# Patient Record
Sex: Female | Born: 1935 | Race: White | Hispanic: No | Smoking: Never smoker
Health system: Southern US, Community
[De-identification: ages and names within clinical notes are randomized; demographics above are authoritative.]

## PROBLEM LIST (undated history)

## (undated) DIAGNOSIS — Z9049 Acquired absence of other specified parts of digestive tract: Secondary | ICD-10-CM

## (undated) DIAGNOSIS — M5090 Cervical disc disorder, unspecified, unspecified cervical region: Secondary | ICD-10-CM

## (undated) DIAGNOSIS — J8410 Pulmonary fibrosis, unspecified: Secondary | ICD-10-CM

## (undated) DIAGNOSIS — L409 Psoriasis, unspecified: Secondary | ICD-10-CM

## (undated) DIAGNOSIS — I10 Essential (primary) hypertension: Secondary | ICD-10-CM

## (undated) DIAGNOSIS — I482 Chronic atrial fibrillation, unspecified: Secondary | ICD-10-CM

## (undated) DIAGNOSIS — M1990 Unspecified osteoarthritis, unspecified site: Secondary | ICD-10-CM

## (undated) DIAGNOSIS — E785 Hyperlipidemia, unspecified: Secondary | ICD-10-CM

## (undated) DIAGNOSIS — Z87898 Personal history of other specified conditions: Secondary | ICD-10-CM

## (undated) DIAGNOSIS — C801 Malignant (primary) neoplasm, unspecified: Secondary | ICD-10-CM

## (undated) DIAGNOSIS — R05 Cough: Secondary | ICD-10-CM

## (undated) DIAGNOSIS — I38 Endocarditis, valve unspecified: Secondary | ICD-10-CM

## (undated) DIAGNOSIS — S329XXA Fracture of unspecified parts of lumbosacral spine and pelvis, initial encounter for closed fracture: Secondary | ICD-10-CM

## (undated) HISTORY — DX: Cough: R05

## (undated) HISTORY — DX: Psoriasis, unspecified: L40.9

## (undated) HISTORY — DX: Fracture of unspecified parts of lumbosacral spine and pelvis, initial encounter for closed fracture: S32.9XXA

## (undated) HISTORY — DX: Endocarditis, valve unspecified: I38

## (undated) HISTORY — DX: Personal history of other specified conditions: Z87.898

## (undated) HISTORY — PX: OTHER SURGICAL HISTORY: SHX169

## (undated) HISTORY — DX: Malignant (primary) neoplasm, unspecified: C80.1

## (undated) HISTORY — DX: Acquired absence of other specified parts of digestive tract: Z90.49

## (undated) HISTORY — DX: Hyperlipidemia, unspecified: E78.5

## (undated) HISTORY — DX: Essential (primary) hypertension: I10

## (undated) HISTORY — DX: Unspecified osteoarthritis, unspecified site: M19.90

## (undated) HISTORY — PX: SKIN CANCER EXCISION: SHX779

## (undated) HISTORY — PX: TOTAL ABDOMINAL HYSTERECTOMY W/ BILATERAL SALPINGOOPHORECTOMY: SHX83

## (undated) HISTORY — DX: Cervical disc disorder, unspecified, unspecified cervical region: M50.90

---

## 2004-07-10 HISTORY — PX: CARDIAC CATHETERIZATION: SHX172

## 2005-07-10 HISTORY — PX: BACK SURGERY: SHX140

## 2009-12-24 ENCOUNTER — Emergency Department: Payer: Self-pay | Admitting: Emergency Medicine

## 2010-07-10 DIAGNOSIS — S329XXA Fracture of unspecified parts of lumbosacral spine and pelvis, initial encounter for closed fracture: Secondary | ICD-10-CM

## 2010-07-10 HISTORY — DX: Fracture of unspecified parts of lumbosacral spine and pelvis, initial encounter for closed fracture: S32.9XXA

## 2010-12-07 ENCOUNTER — Emergency Department: Payer: Self-pay | Admitting: Unknown Physician Specialty

## 2011-02-08 DIAGNOSIS — R053 Chronic cough: Secondary | ICD-10-CM

## 2011-02-08 HISTORY — DX: Chronic cough: R05.3

## 2011-04-03 ENCOUNTER — Ambulatory Visit: Payer: Self-pay | Admitting: Internal Medicine

## 2011-05-17 ENCOUNTER — Ambulatory Visit: Payer: Self-pay | Admitting: Internal Medicine

## 2011-11-16 ENCOUNTER — Inpatient Hospital Stay: Payer: Self-pay | Admitting: Internal Medicine

## 2011-11-16 LAB — CBC WITH DIFFERENTIAL/PLATELET
Basophil #: 0 10*3/uL (ref 0.0–0.1)
Basophil %: 0.7 %
Eosinophil %: 3 %
HCT: 41 % (ref 35.0–47.0)
Lymphocyte %: 28.3 %
Monocyte #: 1.1 x10 3/mm — ABNORMAL HIGH (ref 0.2–0.9)
Monocyte %: 17.4 %
Neutrophil #: 3.1 10*3/uL (ref 1.4–6.5)
Neutrophil %: 50.6 %
Platelet: 138 10*3/uL — ABNORMAL LOW (ref 150–440)
RBC: 4.22 10*6/uL (ref 3.80–5.20)
RDW: 14.3 % (ref 11.5–14.5)

## 2011-11-16 LAB — COMPREHENSIVE METABOLIC PANEL
Albumin: 3.3 g/dL — ABNORMAL LOW (ref 3.4–5.0)
Alkaline Phosphatase: 89 U/L (ref 50–136)
BUN: 41 mg/dL — ABNORMAL HIGH (ref 7–18)
Bilirubin,Total: 0.2 mg/dL (ref 0.2–1.0)
Calcium, Total: 8.5 mg/dL (ref 8.5–10.1)
Chloride: 102 mmol/L (ref 98–107)
Co2: 30 mmol/L (ref 21–32)
EGFR (African American): 37 — ABNORMAL LOW
EGFR (Non-African Amer.): 32 — ABNORMAL LOW
Glucose: 126 mg/dL — ABNORMAL HIGH (ref 65–99)
Osmolality: 291 (ref 275–301)
Potassium: 3.5 mmol/L (ref 3.5–5.1)
Sodium: 140 mmol/L (ref 136–145)
Total Protein: 7 g/dL (ref 6.4–8.2)

## 2011-11-16 LAB — CK TOTAL AND CKMB (NOT AT ARMC): CK, Total: 143 U/L (ref 21–215)

## 2011-11-17 LAB — CBC WITH DIFFERENTIAL/PLATELET
Basophil #: 0 10*3/uL (ref 0.0–0.1)
Basophil %: 0.9 %
Eosinophil %: 1 %
HGB: 13.1 g/dL (ref 12.0–16.0)
Lymphocyte #: 1.2 10*3/uL (ref 1.0–3.6)
Lymphocyte %: 23.4 %
MCH: 32 pg (ref 26.0–34.0)
Monocyte #: 0.4 x10 3/mm (ref 0.2–0.9)
Neutrophil #: 3.5 10*3/uL (ref 1.4–6.5)
Neutrophil %: 67.8 %
Platelet: 136 10*3/uL — ABNORMAL LOW (ref 150–440)
WBC: 5.2 10*3/uL (ref 3.6–11.0)

## 2011-11-17 LAB — URINALYSIS, COMPLETE
Bacteria: NONE SEEN
Blood: NEGATIVE
Leukocyte Esterase: NEGATIVE
Nitrite: NEGATIVE
RBC,UR: 11 /HPF (ref 0–5)
Transitional Epi: <1
WBC UR: 1 /HPF (ref 0–5)

## 2011-11-17 LAB — BASIC METABOLIC PANEL
Anion Gap: 8 (ref 7–16)
BUN: 35 mg/dL — ABNORMAL HIGH (ref 7–18)
Calcium, Total: 8.2 mg/dL — ABNORMAL LOW (ref 8.5–10.1)
Creatinine: 1.28 mg/dL (ref 0.60–1.30)
EGFR (Non-African Amer.): 38 — ABNORMAL LOW
Glucose: 202 mg/dL — ABNORMAL HIGH (ref 65–99)
Potassium: 3.9 mmol/L (ref 3.5–5.1)

## 2011-11-17 LAB — CK TOTAL AND CKMB (NOT AT ARMC)
CK, Total: 132 U/L (ref 21–215)
CK-MB: 2.8 ng/mL (ref 0.5–3.6)

## 2011-11-17 LAB — SEDIMENTATION RATE: Erythrocyte Sed Rate: 43 mm/hr — ABNORMAL HIGH (ref 0–30)

## 2011-11-17 LAB — TROPONIN I: Troponin-I: <0.02 ng/mL

## 2011-11-18 LAB — CBC WITH DIFFERENTIAL/PLATELET
Basophil #: 0 10*3/uL (ref 0.0–0.1)
Basophil %: 0.1 %
Eosinophil %: 0 %
HCT: 39.4 % (ref 35.0–47.0)
Lymphocyte %: 16.7 %
MCH: 32.2 pg (ref 26.0–34.0)
MCHC: 33.3 g/dL (ref 32.0–36.0)
Monocyte #: 0.4 x10 3/mm (ref 0.2–0.9)
Platelet: 170 10*3/uL (ref 150–440)
RDW: 14.3 % (ref 11.5–14.5)
WBC: 7.2 10*3/uL (ref 3.6–11.0)

## 2011-11-18 LAB — BASIC METABOLIC PANEL
BUN: 29 mg/dL — ABNORMAL HIGH (ref 7–18)
Calcium, Total: 8.8 mg/dL (ref 8.5–10.1)
Chloride: 105 mmol/L (ref 98–107)
Creatinine: 1.15 mg/dL (ref 0.60–1.30)
EGFR (African American): 50 — ABNORMAL LOW
Glucose: 137 mg/dL — ABNORMAL HIGH (ref 65–99)
Osmolality: 289 (ref 275–301)
Potassium: 4.3 mmol/L (ref 3.5–5.1)

## 2011-11-22 LAB — CULTURE, BLOOD (SINGLE)

## 2012-02-13 ENCOUNTER — Encounter: Payer: Self-pay | Admitting: Internal Medicine

## 2012-03-10 ENCOUNTER — Encounter: Payer: Self-pay | Admitting: Internal Medicine

## 2012-03-22 ENCOUNTER — Ambulatory Visit: Payer: Self-pay | Admitting: Cardiovascular Disease

## 2012-03-28 ENCOUNTER — Encounter: Payer: Self-pay | Admitting: *Deleted

## 2012-04-04 ENCOUNTER — Ambulatory Visit (INDEPENDENT_AMBULATORY_CARE_PROVIDER_SITE_OTHER): Payer: Medicare HMO | Admitting: Cardiovascular Disease

## 2012-04-04 ENCOUNTER — Encounter: Payer: Self-pay | Admitting: Cardiovascular Disease

## 2012-04-04 VITALS — BP 132/78 | HR 79 | Ht 60.0 in | Wt 151.2 lb

## 2012-04-04 DIAGNOSIS — R0602 Shortness of breath: Secondary | ICD-10-CM

## 2012-04-04 DIAGNOSIS — I38 Endocarditis, valve unspecified: Secondary | ICD-10-CM

## 2012-04-04 NOTE — Patient Instructions (Addendum)
Take Furosemide 20 mg daily as needed for leg swelling or weight gain of >2 lbs in 48 hours Follow up in 6 months

## 2012-04-05 DIAGNOSIS — I38 Endocarditis, valve unspecified: Secondary | ICD-10-CM | POA: Insufficient documentation

## 2012-04-05 DIAGNOSIS — R0602 Shortness of breath: Secondary | ICD-10-CM | POA: Insufficient documentation

## 2012-04-05 NOTE — Assessment & Plan Note (Signed)
This is likely multifactorial due to pulmonary hypertension, diastolic heart failure and age related physical deconditioning. She does not appear to be significantly fluid overloaded. She uses Lasix only as needed. No symptoms suggestive of angina.

## 2012-04-05 NOTE — Progress Notes (Signed)
HPI  This is an 78 year old female who was referred by Dr. Daniel Nones to establish cardiovascular care. The patient has history of valvular heart disease. Echocardiogram done in February of 2012 showed normal LV systolic function, moderate left ventricular hypertrophy, aortic sclerosis with moderate to severe aortic insufficiency, mild mitral regurgitation with mild pulmonary hypertension. The patient was being followed by Dr. Darrold Junker but requested transfer. She also has possible interstitial lung disease with restrictive pulmonary function testing. She was recently evaluated by Dr. Ala Dach at the pulmonary hypertension clinic at [facility]. It appears that she had a repeat echocardiogram. Which showed mild to moderate pulmonary hypertension with a TR jet velocity of 2.7 m/s. It was thought that the etiology was diastolic heart failure possible lung disease. The severity did not worsen further treatment. The patient overall is doing reasonably well. She has mild exertional dyspnea which has not worsened. She denies any recent lower extremity edema. She did have problem in the past but this does not seem to be an issue at this time. She denies any chest pain, dizziness or syncope.  Allergies  Allergen Reactions  . Lipitor (Atorvastatin)   . Simvastatin      Current Outpatient Prescriptions on File Prior to Visit  Medication Sig Dispense Refill  . diltiazem (DILACOR XR) 180 MG 24 hr capsule Take 180 mg by mouth daily.      Marland Kitchen olmesartan (BENICAR) 40 MG tablet Take 40 mg by mouth daily.         Past Medical History  Diagnosis Date  . Chronic cough 02/2011    CXR with increased interstitial markings [facility] pulmonology  . Psoriasis     elbows  . Hypertension   . Arthritis   . Pelvis fracture 2012  . History of dizziness   . Valvular heart disease   . Cervical disc disease   . Hx of appendectomy   . Hyperlipidemia   . Cancer     skin     Past Surgical History  Procedure Date  . Back surgery  2007  . Total abdominal hysterectomy w/ bilateral salpingoophorectomy   . Skin cancer excision   . Cardiac catheterization 2006     History reviewed. No pertinent family history.   History   Social History  . Marital Status: Widowed    Spouse Name: N/A    Number of Children: N/A  . Years of Education: N/A   Occupational History  . Not on file.   Social History Main Topics  . Smoking status: Never Smoker   . Smokeless tobacco: Not on file  . Alcohol Use: Yes     occasional  . Drug Use: No  . Sexually Active:    Other Topics Concern  . Not on file   Social History Narrative  . No narrative on file     ROS Constitutional: Negative for fever, chills, diaphoresis, activity change, appetite change and fatigue.  HENT: Negative for hearing loss, nosebleeds, congestion, sore throat, facial swelling, drooling, trouble swallowing, neck pain, voice change, sinus pressure and tinnitus.  Eyes: Negative for photophobia, pain, discharge and visual disturbance.  Respiratory: Negative for apnea, cough, chest tightness  and wheezing.  Cardiovascular: Negative for chest pain, palpitations and leg swelling.  Gastrointestinal: Negative for nausea, vomiting, abdominal pain, diarrhea, constipation, blood in stool and abdominal distention.  Genitourinary: Negative for dysuria, urgency, frequency, hematuria and decreased urine volume.  Musculoskeletal: Negative for myalgias, back pain, joint swelling, arthralgias and gait problem.  Skin: Negative  for color change, pallor, rash and wound.  Neurological: Negative for dizziness, tremors, seizures, syncope, speech difficulty, weakness, light-headedness, numbness and headaches.  Psychiatric/Behavioral: Negative for suicidal ideas, hallucinations, behavioral problems and agitation. The patient is not nervous/anxious.     PHYSICAL EXAM   BP 132/78  Pulse 79  Ht 5' (1.524 m)  Wt 151 lb 4 oz (68.607 kg)  BMI 29.54 kg/m2 Constitutional: She  is oriented to person, place, and time. She appears well-developed and well-nourished. No distress.  HENT: No nasal discharge.  Head: Normocephalic and atraumatic.  Eyes: Pupils are equal and round. Right eye exhibits no discharge. Left eye exhibits no discharge.  Neck: Normal range of motion. Neck supple. No JVD present. No thyromegaly present.  Cardiovascular: Normal rate, regular rhythm, normal heart sounds. Exam reveals no gallop and no friction rub. There is a 2/6 systolic ejection murmur at the aortic area without any diastolic murmur.  Pulmonary/Chest: Effort normal and breath sounds normal. No stridor. No respiratory distress. She has no wheezes. She has no rales. She exhibits no tenderness.  Abdominal: Soft. Bowel sounds are normal. She exhibits no distension. There is no tenderness. There is no rebound and no guarding.  Musculoskeletal: Normal range of motion. She exhibits no edema and no tenderness.  Neurological: She is alert and oriented to person, place, and time. Coordination normal.  Skin: Skin is warm and dry. No rash noted. She is not diaphoretic. No erythema. No pallor.  Psychiatric: She has a normal mood and affect. Her behavior is normal. Judgment and thought content normal.     EKG: Sinus  Rhythm  -Left axis -anterior fascicular block.   ABNORMAL    ASSESSMENT AND PLAN

## 2012-04-05 NOTE — Assessment & Plan Note (Signed)
She has a heart murmur suggestive of aortic sclerosis. There was a previous mention of moderate to severe aortic insufficiency. She had a recent echocardiogram at [facility]. I will request a copy.

## 2012-04-09 ENCOUNTER — Encounter: Payer: Self-pay | Admitting: Internal Medicine

## 2012-05-10 ENCOUNTER — Encounter: Payer: Self-pay | Admitting: Internal Medicine

## 2012-05-29 ENCOUNTER — Ambulatory Visit: Payer: Self-pay | Admitting: Physical Medicine and Rehabilitation

## 2012-06-09 ENCOUNTER — Encounter: Payer: Self-pay | Admitting: Internal Medicine

## 2012-06-16 ENCOUNTER — Emergency Department: Payer: Self-pay | Admitting: Emergency Medicine

## 2012-07-10 ENCOUNTER — Encounter: Payer: Self-pay | Admitting: Internal Medicine

## 2012-08-10 ENCOUNTER — Encounter: Payer: Self-pay | Admitting: Internal Medicine

## 2012-08-26 ENCOUNTER — Encounter: Payer: Self-pay | Admitting: Pulmonary Disease

## 2012-08-26 ENCOUNTER — Ambulatory Visit (INDEPENDENT_AMBULATORY_CARE_PROVIDER_SITE_OTHER): Payer: Medicare HMO | Admitting: Pulmonary Disease

## 2012-08-26 VITALS — BP 120/70 | HR 74 | Temp 98.2°F | Ht 60.0 in | Wt 155.0 lb

## 2012-08-26 DIAGNOSIS — R059 Cough, unspecified: Secondary | ICD-10-CM

## 2012-08-26 DIAGNOSIS — J8410 Pulmonary fibrosis, unspecified: Secondary | ICD-10-CM | POA: Insufficient documentation

## 2012-08-26 DIAGNOSIS — I499 Cardiac arrhythmia, unspecified: Secondary | ICD-10-CM | POA: Insufficient documentation

## 2012-08-26 DIAGNOSIS — R05 Cough: Secondary | ICD-10-CM

## 2012-08-26 NOTE — Assessment & Plan Note (Signed)
Likely due to pulmonary fibrosis and exacerbated by post nasal drip.  She states that it doesn't bother her too much.  Plan -advised to start saline rinses for sinus congestion if the cough starts to bother her

## 2012-08-26 NOTE — Progress Notes (Signed)
Subjective:    Patient ID: [name], female    DOB: [birth year], 79 y.o.   MRN: [id number]  HPI  [name] is an 79 year old female with pulmonary fibrosis who comes to our clinic for evaluation and management of the same. She had no childhood respiratory illnesses and experienced no significant respiratory symptoms as an adult until about 2007. She never smoked cigarettes. 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 She does not recall the details of the workup of her illness but she states that she was eventually diagnosed with pulmonary fibrosis. She is unaware of what type of fibrosis she has. She has never had a biopsy. She has been followed by a physician at [facility] for this for the last several years and apparently this physician is changing roles and so she is seeking a new pulmonologist. She tells me that she experiences a cough in the mornings productive of clear to yellow sputum. She refused oxygen for many years but apparently agreed to start using it in the last year. She denies any shortness of breath but she states that she "gives out easy". She just completed pulmonary rehabilitation at [facility] last week. Her primary care physician is Dr. Worthy Keeler. She denies acid reflux or sinus symptoms.   Past Medical History  Diagnosis Date  . Chronic cough 02/2011    CXR with increased interstitial markings [facility] pulmonology  . Psoriasis     elbows  . Hypertension   . Arthritis   . Pelvis fracture 2012  . History of dizziness   . Valvular heart disease   . Cervical disc disease   . Hx of appendectomy   . Hyperlipidemia   . Cancer     skin     No family history on file.   History   Social History  . Marital Status: Widowed    Spouse Name: N/A    Number of Children: N/A  . Years of Education: N/A   Occupational History  . Not on file.   Social History Main Topics  . Smoking status: Never Smoker    . Smokeless tobacco: Not on file  . Alcohol Use: Yes     Comment: occasional  . Drug Use: No  . Sexually Active:    Other Topics Concern  . Not on file   Social History Narrative  . No narrative on file     Allergies  Allergen Reactions  . Lipitor (Atorvastatin)   . Simvastatin      Outpatient Prescriptions Prior to Visit  Medication Sig Dispense Refill  . diltiazem (DILACOR XR) 180 MG 24 hr capsule Take 180 mg by mouth daily.      . NON FORMULARY Oxygen 2-3 liter as needed.      Marland Kitchen olmesartan (BENICAR) 40 MG tablet Take 40 mg by mouth daily.      . Biotin 5000 MCG CAPS Take by mouth daily.      . Coenzyme Q10 (CO Q 10 PO) Take 50 mg by mouth.       No facility-administered medications prior to visit.      Review of Systems  Constitutional: Negative for fever and unexpected weight change.  HENT: Negative for ear pain, nosebleeds, congestion, sore throat, rhinorrhea, sneezing, trouble swallowing, dental problem, postnasal drip and sinus pressure.   Eyes: Negative for redness and itching.  Respiratory: Positive for cough and shortness of breath. Negative  for chest tightness and wheezing.   Cardiovascular: Negative for palpitations and leg swelling.  Gastrointestinal: Negative for nausea and vomiting.  Genitourinary: Negative for dysuria.  Musculoskeletal: Negative for joint swelling.  Skin: Negative for rash.  Neurological: Negative for headaches.  Hematological: Does not bruise/bleed easily.  Psychiatric/Behavioral: Negative for dysphoric mood. The patient is not nervous/anxious.        Objective:   Physical Exam  Filed Vitals:   08/26/12 1504  BP: 120/70  Pulse: 74  Temp: 98.2 F (36.8 C)  TempSrc: Oral  Height: 5' (1.524 m)  Weight: 155 lb (70.308 kg)  SpO2: 93%   2 L nasal cannula  Gen: well appearing, no acute distress HEENT: NCAT, PERRL, EOMi, OP clear, neck supple without masses PULM: Significant wheezing and rhonchi bilaterally with inspiratory  crackles in the bases. CV: RRR, systolic murmur noted, no JVD AB: BS+, soft, nontender, no hsm Ext: warm, trace pretibial edema, no clubbing, no cyanosis Derm: no rash or skin breakdown Neuro: A&Ox4, CN II-XII intact, strength 5/5 in all 4 extremities       Assessment & Plan:   Postinflammatory pulmonary fibrosis It is unclear to me what type of pulmonary fibrosis she has. We need to get records from [facility].  She seems to have very little symptoms other than cough which really doesn't bother her that much.  I think that she minimizes her symptoms.    Plan: -obtain records from [facility] -continue oxygen as written -obtain records from [facility] for data -4-6 month follow up  Cough Likely due to pulmonary fibrosis and exacerbated by post nasal drip.  She states that it doesn't bother her too much.  Plan -advised to start saline rinses for sinus congestion if the cough starts to bother her  Irregular heart beat Today's EKG showed frequent PAC's but no pathologic rhythm.  She does not have symptoms related to this so we are both reassured by the EKG.   Updated Medication List Outpatient Encounter Prescriptions as of 08/26/2012  Medication Sig Dispense Refill  . diltiazem (DILACOR XR) 180 MG 24 hr capsule Take 180 mg by mouth daily.      . NON FORMULARY Oxygen 2-3 liter as needed.      Marland Kitchen olmesartan (BENICAR) 40 MG tablet Take 40 mg by mouth daily.      . [DISCONTINUED] Biotin 5000 MCG CAPS Take by mouth daily.      . [DISCONTINUED] Coenzyme Q10 (CO Q 10 PO) Take 50 mg by mouth.       No facility-administered encounter medications on file as of 08/26/2012.

## 2012-08-26 NOTE — Patient Instructions (Addendum)
Keep exercising at [employer] as you are doing We will request records from [facility] and [employer] [facility] using your oxygen as you are doing 2L/min at baseline and 4L/min while exerting yourself  We will see you back in 6 months or sooner if needed

## 2012-08-26 NOTE — Assessment & Plan Note (Signed)
Today's EKG showed frequent PAC's but no pathologic rhythm.  She does not have symptoms related to this so we are both reassured by the EKG.

## 2012-08-26 NOTE — Assessment & Plan Note (Signed)
It is unclear to me what type of pulmonary fibrosis she has. We need to get records from [facility].  She seems to have very little symptoms other than cough which really doesn't bother her that much.  I think that she minimizes her symptoms.    Plan: -obtain records from [facility] -continue oxygen as written -obtain records from [facility] for data -4-6 month follow up

## 2012-11-18 ENCOUNTER — Ambulatory Visit: Payer: Medicare HMO | Admitting: Cardiovascular Disease

## 2012-12-06 ENCOUNTER — Ambulatory Visit (INDEPENDENT_AMBULATORY_CARE_PROVIDER_SITE_OTHER): Payer: Medicare HMO | Admitting: Cardiovascular Disease

## 2012-12-06 ENCOUNTER — Encounter: Payer: Self-pay | Admitting: Cardiovascular Disease

## 2012-12-06 VITALS — BP 143/86 | HR 73 | Ht 60.0 in | Wt 154.8 lb

## 2012-12-06 DIAGNOSIS — I38 Endocarditis, valve unspecified: Secondary | ICD-10-CM

## 2012-12-06 DIAGNOSIS — I499 Cardiac arrhythmia, unspecified: Secondary | ICD-10-CM

## 2012-12-06 NOTE — Patient Instructions (Addendum)
Follow up as needed

## 2012-12-06 NOTE — Progress Notes (Signed)
HPI  This is an 79 year old female who is here today for a followup visit.  She was seen last year for dyspnea and reported aortic insufficiency. Echocardiogram done in February of 2012 showed normal LV systolic function, moderate left ventricular hypertrophy, aortic sclerosis with moderate to severe aortic insufficiency, mild mitral regurgitation with mild pulmonary hypertension. She also has interstitial lung disease with restrictive pulmonary function testing. She was evaluated last year by Dr. Ala Dach at the pulmonary hypertension clinic at [facility]. She had an echocardiogram repeated there which showed normal LV systolic function, diastolic dysfunction, trivial mitral regurgitation, aortic sclerosis with only mild regurgitation and no significant pulmonary hypertension.  She has been attending pulmonary rehabilitation. She had hypoxia with exercise. She responded very well to supplemental oxygen. She denies any chest pain at this point.  Allergies  Allergen Reactions  . Lipitor (Atorvastatin)   . Simvastatin      Current Outpatient Prescriptions on File Prior to Visit  Medication Sig Dispense Refill  . diltiazem (DILACOR XR) 180 MG 24 hr capsule Take 180 mg by mouth daily.      . NON FORMULARY Oxygen 2-4 liter as needed.      Marland Kitchen olmesartan (BENICAR) 40 MG tablet Take 40 mg by mouth daily.       No current facility-administered medications on file prior to visit.     Past Medical History  Diagnosis Date  . Chronic cough 02/2011    CXR with increased interstitial markings [facility] pulmonology  . Psoriasis     elbows  . Hypertension   . Arthritis   . Pelvis fracture 2012  . History of dizziness   . Valvular heart disease   . Cervical disc disease   . Hx of appendectomy   . Hyperlipidemia   . Cancer     skin     Past Surgical History  Procedure Laterality Date  . Back surgery  2007  . Total abdominal hysterectomy w/ bilateral salpingoophorectomy    . Skin cancer excision    .  Cardiac catheterization  2006     History reviewed. No pertinent family history.   History   Social History  . Marital Status: Widowed    Spouse Name: N/A    Number of Children: N/A  . Years of Education: N/A   Occupational History  . Not on file.   Social History Main Topics  . Smoking status: Never Smoker   . Smokeless tobacco: Not on file  . Alcohol Use: Yes     Comment: occasional  . Drug Use: No  . Sexually Active:    Other Topics Concern  . Not on file   Social History Narrative  . No narrative on file     ROS Constitutional: Negative for fever, chills, diaphoresis, activity change, appetite change and fatigue.  HENT: Negative for hearing loss, nosebleeds, congestion, sore throat, facial swelling, drooling, trouble swallowing, neck pain, voice change, sinus pressure and tinnitus.  Eyes: Negative for photophobia, pain, discharge and visual disturbance.  Respiratory: Negative for apnea, cough, chest tightness  and wheezing.  Cardiovascular: Negative for chest pain, palpitations and leg swelling.  Gastrointestinal: Negative for nausea, vomiting, abdominal pain, diarrhea, constipation, blood in stool and abdominal distention.  Genitourinary: Negative for dysuria, urgency, frequency, hematuria and decreased urine volume.  Musculoskeletal: Negative for myalgias, back pain, joint swelling, arthralgias and gait problem.  Skin: Negative for color change, pallor, rash and wound.  Neurological: Negative for dizziness, tremors, seizures, syncope, speech difficulty, weakness,  light-headedness, numbness and headaches.  Psychiatric/Behavioral: Negative for suicidal ideas, hallucinations, behavioral problems and agitation. The patient is not nervous/anxious.     PHYSICAL EXAM   BP 143/86  Pulse 73  Ht 5' (1.524 m)  Wt 154 lb 12 oz (70.194 kg)  BMI 30.22 kg/m2 Constitutional: She is oriented to person, place, and time. She appears well-developed and well-nourished. No  distress.  HENT: No nasal discharge.  Head: Normocephalic and atraumatic.  Eyes: Pupils are equal and round. Right eye exhibits no discharge. Left eye exhibits no discharge.  Neck: Normal range of motion. Neck supple. No JVD present. No thyromegaly present.  Cardiovascular: Normal rate, regular rhythm, normal heart sounds. Exam reveals no gallop and no friction rub. There is a 2/6 systolic ejection murmur at the aortic area without any diastolic murmur.  Pulmonary/Chest: Effort normal and breath sounds normal. No stridor. No respiratory distress. She has no wheezes. She has dry crackles. She exhibits no tenderness.  Abdominal: Soft. Bowel sounds are normal. She exhibits no distension. There is no tenderness. There is no rebound and no guarding.  Musculoskeletal: Normal range of motion. She exhibits no edema and no tenderness.  Neurological: She is alert and oriented to person, place, and time. Coordination normal.  Skin: Skin is warm and dry. No rash noted. She is not diaphoretic. No erythema. No pallor.  Psychiatric: She has a normal mood and affect. Her behavior is normal. Judgment and thought content normal.      ASSESSMENT AND PLAN

## 2012-12-06 NOTE — Assessment & Plan Note (Signed)
Most recent echocardiogram in September of last year showed only mild aortic insufficiency and no other significant abnormalities. Thus, she can followup with [place] as needed. Most of her symptoms seems to be related to pulmonary fibrosis.

## 2012-12-06 NOTE — Assessment & Plan Note (Signed)
She was noted to have frequent PACs and recent EKG. However, she is asymptomatic. LV systolic function was normal last year. No further treatment is warranted.

## 2012-12-24 ENCOUNTER — Telehealth: Payer: Self-pay | Admitting: Pulmonary Disease

## 2012-12-24 ENCOUNTER — Encounter: Payer: Self-pay | Admitting: Internal Medicine

## 2012-12-24 NOTE — Telephone Encounter (Signed)
We did an EKG but I do not see where we have ever done a spirometry or PFT. BQ did not order rehab, pt was already enrolled.  [id number]. Carron Curie, CMA

## 2012-12-25 NOTE — Telephone Encounter (Signed)
I spoke with [name] and made her aware. She stated she would like the EKG faxed over to her. This has been done

## 2012-12-25 NOTE — Telephone Encounter (Signed)
Laureen from [employer] returning call can be reached at [phone].[name]

## 2013-01-07 ENCOUNTER — Encounter: Payer: Self-pay | Admitting: Internal Medicine

## 2013-02-07 ENCOUNTER — Encounter: Payer: Self-pay | Admitting: Internal Medicine

## 2013-03-10 ENCOUNTER — Encounter: Payer: Self-pay | Admitting: Internal Medicine

## 2013-04-09 ENCOUNTER — Encounter: Payer: Self-pay | Admitting: Internal Medicine

## 2013-05-09 ENCOUNTER — Encounter: Payer: Self-pay | Admitting: Pulmonary Disease

## 2013-05-10 ENCOUNTER — Encounter: Payer: Self-pay | Admitting: Internal Medicine

## 2013-06-09 ENCOUNTER — Encounter: Payer: Self-pay | Admitting: Internal Medicine

## 2013-10-28 ENCOUNTER — Emergency Department: Payer: Self-pay | Admitting: Emergency Medicine

## 2013-11-03 ENCOUNTER — Ambulatory Visit: Payer: Self-pay | Admitting: Internal Medicine

## 2013-11-11 ENCOUNTER — Encounter: Payer: Self-pay | Admitting: Pulmonary Disease

## 2013-11-11 ENCOUNTER — Ambulatory Visit (INDEPENDENT_AMBULATORY_CARE_PROVIDER_SITE_OTHER): Payer: Medicare HMO | Admitting: Pulmonary Disease

## 2013-11-11 VITALS — BP 122/64 | HR 74 | Ht 60.0 in | Wt 149.0 lb

## 2013-11-11 DIAGNOSIS — J8410 Pulmonary fibrosis, unspecified: Secondary | ICD-10-CM

## 2013-11-11 DIAGNOSIS — J849 Interstitial pulmonary disease, unspecified: Secondary | ICD-10-CM

## 2013-11-11 NOTE — Progress Notes (Signed)
Subjective:    Patient ID: [name], female    DOB: [birth year], 80 y.o.   MRN: [id number]  Synopsis: First seen by [facility] in 08/1192 for ILD of uncertain etiology previously followed by [facility].    HPI  11/11/2013 ROV >> [name] hasn't been seen in about 18 months or so by [place].  She hasn't been feeling short of breath since the last visit.  She continues to use oxygen.  She hasn't been seen by [facility] in a few years.  Overall she doesn't really feel like her lungs bother her much.  Her oxygen level has not changed in the last few years. She has been doing a [employer] exercise program in her basement.  She has difficulty walking due to thigh weakness and back pain, not dyspnea.  Minimal cough.  Past Medical History  Diagnosis Date  . Chronic cough 02/2011    CXR with increased interstitial markings [facility] pulmonology  . Psoriasis     elbows  . Hypertension   . Arthritis   . Pelvis fracture 2012  . History of dizziness   . Valvular heart disease   . Cervical disc disease   . Hx of appendectomy   . Hyperlipidemia   . Cancer     skin     Review of Systems     Objective:   Physical Exam Filed Vitals:   11/11/13 1059  BP: 122/64  Pulse: 74  Height: 5' (1.524 m)  Weight: 149 lb (67.586 kg)  SpO2: 95%   3Lpm  Gen: well appearing, no acute distress HEENT: NCAT, PERRL, EOMi, OP clear, neck supple without masses PULM:Crackles in bases bilaterally, no wheezing CV: RRR, systolic murmur noted, no JVD AB: BS+, soft, nontender, no hsm Ext: warm, no edema, no clubbing, no cyanosis Derm: no rash or skin breakdown Neuro: A&Ox4, CN II-XII intact, strength 5/5 in all 4 extremities  May 2013 CT chest reviewed> no pulmonary embolism, there is significant groundglass interstitial changes with some bronchiectasis in the bases. There is no clear honeycombing. The groundglass and interstitial thickening does not seem to be localized to the periphery. The interstitial changes are  more significant in the bases of the lungs than in the upper lobes.     Assessment & Plan:   Postinflammatory pulmonary fibrosis She appears to be stable and that her oxygen requirements have not changed in the last several years. Further, she is not expressing shortness of breath of any kind despite the market changes seen on her CT chest.  As for the etiology of her interstitial lung disease is feeling not clear to me. At this point it doesn't appear to be progressive.  I will again request records from [facility] to try to figure out what sort of a workup has been done in the past.  Plan: -Obtain pulmonary function tests that I can compare to what was done prior [place] old records from [facility] as written -At this point no indication for treatment -If I am unable to get any feedback from [facility] then I will consider my on workup, however considering the relative stability of her disease and her age it's not clear to me that a workup will change her management.    Updated Medication List Outpatient Encounter Prescriptions as of 11/11/2013  Medication Sig  . diltiazem (DILACOR XR) 180 MG 24 hr capsule Take 180 mg by mouth daily.  Marland Kitchen levothyroxine (SYNTHROID, LEVOTHROID) 50 MCG tablet  Take 50 mcg by mouth daily before breakfast.  . NON FORMULARY Oxygen 2-4 liter as needed.  Marland Kitchen olmesartan (BENICAR) 40 MG tablet Take 40 mg by mouth daily.

## 2013-11-11 NOTE — Patient Instructions (Signed)
We will request records again from [facility] We will order pulmonary function testing at [facility] using your oxygen as you are doing. We will see you back in 6 months or sooner if needed

## 2013-11-11 NOTE — Assessment & Plan Note (Addendum)
She appears to be stable and that her oxygen requirements have not changed in the last several years. Further, she is not expressing shortness of breath of any kind despite the market changes seen on her CT chest.  I do not know what sort of interstitial lung disease she has.  Her CT from May 2014 did not look to be consistent with UIP but this would be in the differential.  Chronic fibrosing and NSIP could also be considered. At this point it doesn't appear to be progressive.  I will again request records from [facility] to try to figure out what sort of a workup has been done in the past.  Plan: -Obtain pulmonary function tests that I can compare to what was done prior [place] old records from [place] [facility] as written -At this point no indication for treatment -If I am unable to get any feedback from [facility] then I will consider my on workup, however considering the relative stability of her disease and her age it's not clear to me that a workup will change her management.

## 2013-11-17 ENCOUNTER — Ambulatory Visit: Payer: Self-pay | Admitting: Pulmonary Disease

## 2013-11-17 LAB — PULMONARY FUNCTION TEST

## 2013-12-09 ENCOUNTER — Encounter: Payer: Self-pay | Admitting: Pulmonary Disease

## 2013-12-10 ENCOUNTER — Telehealth: Payer: Self-pay

## 2013-12-10 NOTE — Telephone Encounter (Signed)
lmtcb X1 

## 2013-12-10 NOTE — Telephone Encounter (Signed)
Message copied by Len Blalock on Wed Dec 10, 2013  9:51 AM ------      Message from: Juanito Doom      Created: Tue Dec 09, 2013  9:49 PM       A,      Please let her know that I have seen her recent PFTs and they fit with pulmonary fibrosis, but I haven't seen the studies from [facility] yet to know if her disease has progressed.  Can we send a request to [facility] to ask for pulmonary clinic notes and PFTs?            Thanks      B ------

## 2013-12-18 NOTE — Telephone Encounter (Signed)
lmtcb x2 

## 2013-12-30 NOTE — Telephone Encounter (Signed)
lmtcb X3 

## 2014-01-02 NOTE — Telephone Encounter (Signed)
lmtcb X4. Letter typed and sent per triage protocol.

## 2014-02-06 ENCOUNTER — Encounter: Payer: Self-pay | Admitting: Pulmonary Disease

## 2014-03-12 ENCOUNTER — Ambulatory Visit: Payer: Medicare HMO | Admitting: Pulmonary Disease

## 2014-04-13 ENCOUNTER — Ambulatory Visit: Payer: Self-pay | Admitting: Internal Medicine

## 2014-04-20 ENCOUNTER — Ambulatory Visit: Payer: Medicare HMO | Admitting: Pulmonary Disease

## 2014-05-25 ENCOUNTER — Encounter: Payer: Self-pay | Admitting: Pulmonary Disease

## 2014-05-25 ENCOUNTER — Ambulatory Visit (INDEPENDENT_AMBULATORY_CARE_PROVIDER_SITE_OTHER): Payer: Medicare HMO | Admitting: Pulmonary Disease

## 2014-05-25 VITALS — BP 128/60 | HR 72 | Ht 60.0 in | Wt 145.0 lb

## 2014-05-25 DIAGNOSIS — J8410 Pulmonary fibrosis, unspecified: Secondary | ICD-10-CM

## 2014-05-25 NOTE — Progress Notes (Signed)
Subjective:    Patient ID: [name], female    DOB: April 29, 1924, 80 y.o.   MRN: [id number]  Synopsis: First seen by [facility] in 10/3327 for ILD of uncertain etiology previously followed by [facility].   06 04 2013: 6-minute walk, 700 feet, 90% on 3 liters, 80% on 3 liters.  03/05/2012, 6-minute walk, 732 feet, 92% on 2 liters 2013 Spirogram reveals an FVC of 75% pre, 77% post, FEV1 89% pre, 91% post, 25-75 237% pre, 287% post consistent with a mild restrictive ventilatory defect and no definite improvement with bronchodilator therapy.  11/2013 PFT [facility] > Ratio 89%, FEV1 0.99 (82% pred, no change with BD), TLC 2.96L (77% pred), DLCO 5.8 (37% pred)  HPI  Chief Complaint  Patient presents with  . Follow-up    6 month rov.  Pt has no breathing complaints today.  States she is SOB with anxiety or exertion.       05/25/2014 ROV > Rachell had a hard time finding [place] today because she got off on the wrong exit, so she is upset about that.  However her breathing has been fine lately.  She has not hada significant decline in any of her symptoms and continues to exercise.  She would like to go back tp [employer].  No cough.   Past Medical History  Diagnosis Date  . Chronic cough 02/2011    CXR with increased interstitial markings [facility] pulmonology  . Psoriasis     elbows  . Hypertension   . Arthritis   . Pelvis fracture 2012  . History of dizziness   . Valvular heart disease   . Cervical disc disease   . Hx of appendectomy   . Hyperlipidemia   . Cancer     skin     Review of Systems      Objective:   Physical Exam  Filed Vitals:   05/25/14 1126  BP: 128/60  Pulse: 72  Height: 5' (1.524 m)  Weight: 145 lb (65.772 kg)  SpO2: 91%   3Lpm, ambulated 572feet on and stayed above 90%  Gen: well appearing, no acute distress HEENT: NCAT, EOMi, OP clear, neck supple without masses PULM:Crackles in bases bilaterally, no wheezing CV: RRR, systolic murmur noted, no  JVD AB: BS+, soft, nontender,  Ext: warm, no edema, no clubbing, no cyanosis Derm: no rash or skin breakdown Neuro: A&Ox4, CN II-XII intact, strength 5/5 in all 4 extremities  May 2013 CT chest reviewed> no pulmonary embolism, there is significant groundglass interstitial changes with some bronchiectasis in the bases. There is no clear honeycombing. The groundglass and interstitial thickening does not seem to be localized to the periphery. The interstitial changes are more significant in the bases of the lungs than in the upper lobes.     Assessment & Plan:   Postinflammatory pulmonary fibrosis I was able to see the results from [facility] pulmonary clinic today and there has not been a change in the last 2 years in her PFTs or oxygen needs.  Given her advance age I don't see a reason for further work up and certainly no role for empiric treatment with prednisone.  She would benefit from ongoing exercise.  Plan: -continue O2 at 3LPM -refer to pulmonary rehab     Updated Medication List Outpatient Encounter Prescriptions as of 05/25/2014  Medication Sig  . diltiazem (DILACOR XR) 180 MG 24 hr capsule Take 180 mg by mouth daily.  Marland Kitchen levothyroxine (SYNTHROID, LEVOTHROID) 50 MCG tablet  Take 50 mcg by mouth daily before breakfast.  . NON FORMULARY Oxygen 2-4 liter as needed.  Marland Kitchen olmesartan (BENICAR) 40 MG tablet Take 40 mg by mouth daily.

## 2014-05-25 NOTE — Patient Instructions (Signed)
We will refer you to the [facility] pulmonary rehab center We will see you back in 6 months or sooner if needed

## 2014-05-25 NOTE — Assessment & Plan Note (Signed)
I was able to see the results from [facility] pulmonary clinic today and there has not been a change in the last 2 years in her PFTs or oxygen needs.  Given her advance age I don't see a reason for further work up and certainly no role for empiric treatment with prednisone.  She would benefit from ongoing exercise.  Plan: -continue O2 at 3LPM -refer to pulmonary rehab

## 2014-06-25 ENCOUNTER — Inpatient Hospital Stay: Payer: Self-pay | Admitting: Internal Medicine

## 2014-06-25 LAB — COMPREHENSIVE METABOLIC PANEL
ALK PHOS: 97 U/L
ALT: 23 U/L
Albumin: 3.3 g/dL — ABNORMAL LOW (ref 3.4–5.0)
Anion Gap: 7 (ref 7–16)
BUN: 15 mg/dL (ref 7–18)
Bilirubin,Total: 0.3 mg/dL (ref 0.2–1.0)
Calcium, Total: 9 mg/dL (ref 8.5–10.1)
Chloride: 104 mmol/L (ref 98–107)
Co2: 30 mmol/L (ref 21–32)
Creatinine: 0.91 mg/dL (ref 0.60–1.30)
EGFR (African American): >60
EGFR (Non-African Amer.): >60
Glucose: 104 mg/dL — ABNORMAL HIGH (ref 65–99)
Osmolality: 282 (ref 275–301)
POTASSIUM: 4.1 mmol/L (ref 3.5–5.1)
SGOT(AST): 21 U/L (ref 15–37)
SODIUM: 141 mmol/L (ref 136–145)
TOTAL PROTEIN: 7 g/dL (ref 6.4–8.2)

## 2014-06-25 LAB — TROPONIN I
Troponin-I: 0.02 ng/mL
Troponin-I: 0.02 ng/mL

## 2014-06-25 LAB — CBC
HCT: 43 % (ref 35.0–47.0)
HGB: 13.6 g/dL (ref 12.0–16.0)
MCH: 30.5 pg (ref 26.0–34.0)
MCHC: 31.5 g/dL — AB (ref 32.0–36.0)
MCV: 97 fL (ref 80–100)
Platelet: 221 10*3/uL (ref 150–440)
RBC: 4.44 10*6/uL (ref 3.80–5.20)
RDW: 14.5 % (ref 11.5–14.5)
WBC: 8.4 10*3/uL (ref 3.6–11.0)

## 2014-06-25 LAB — PRO B NATRIURETIC PEPTIDE: B-Type Natriuretic Peptide: 2546 pg/mL — ABNORMAL HIGH (ref 0–450)

## 2014-06-25 LAB — CK TOTAL AND CKMB (NOT AT ARMC)
CK, Total: 71 U/L (ref 26–192)
CK-MB: 1.2 ng/mL (ref 0.5–3.6)

## 2014-06-25 LAB — MAGNESIUM: MAGNESIUM: 2.1 mg/dL

## 2014-06-26 LAB — BASIC METABOLIC PANEL
Anion Gap: 8 (ref 7–16)
BUN: 18 mg/dL (ref 7–18)
CO2: 32 mmol/L (ref 21–32)
CREATININE: 0.88 mg/dL (ref 0.60–1.30)
Calcium, Total: 8.6 mg/dL (ref 8.5–10.1)
Chloride: 101 mmol/L (ref 98–107)
EGFR (African American): >60
Glucose: 143 mg/dL — ABNORMAL HIGH (ref 65–99)
OSMOLALITY: 286 (ref 275–301)
POTASSIUM: 4.1 mmol/L (ref 3.5–5.1)
SODIUM: 141 mmol/L (ref 136–145)

## 2014-06-26 LAB — SEDIMENTATION RATE: Erythrocyte Sed Rate: 35 mm/hr — ABNORMAL HIGH (ref 0–30)

## 2014-06-26 LAB — MAGNESIUM: Magnesium: 2.1 mg/dL

## 2014-07-15 ENCOUNTER — Ambulatory Visit (INDEPENDENT_AMBULATORY_CARE_PROVIDER_SITE_OTHER): Payer: Medicare HMO | Admitting: Pulmonary Disease

## 2014-07-15 VITALS — BP 106/70 | HR 58

## 2014-07-15 DIAGNOSIS — R599 Enlarged lymph nodes, unspecified: Secondary | ICD-10-CM

## 2014-07-15 DIAGNOSIS — J8410 Pulmonary fibrosis, unspecified: Secondary | ICD-10-CM

## 2014-07-15 DIAGNOSIS — R590 Localized enlarged lymph nodes: Secondary | ICD-10-CM | POA: Insufficient documentation

## 2014-07-15 NOTE — Assessment & Plan Note (Signed)
This was seen on the CT scan of her chest. This was performed in the setting of a congestive heart failure exacerbation and could be the explanation, though the lymph node was quite large. The only way to know for certain what is going on here would be to obtain a biopsy but that would be quite difficult and she is not interested in that. We discussed various options and she and I decided to keep an eye on it with a repeat CT scan in 3 months and then we will discuss what to do if it has not changed significantly.

## 2014-07-15 NOTE — Assessment & Plan Note (Signed)
I have reviewed the images from the recent CT scan of her chest. Radiology feels that UIP is possible, which certainly considering the peripheral nature of the findings and her age one should consider UIP. However, her fibrosis has been present for years and her lung function testing has not progressed during that time. Her oxygenation has also remained normal during that time as well as her functional status. At this point I do not feel strongly that she has usual interstitial pneumonitis as this would have likely progressed I this point.  Plan: -Continue monitoring off of anti-fibrotic therapy with every 6 month visits  -continue oxygen as written

## 2014-07-15 NOTE — Progress Notes (Signed)
Subjective:    Patient ID: [name], female    DOB: 06-Oct-1923, 81 y.o.   MRN: [id number]  Synopsis: First seen by [facility] in 03/3266 for ILD of uncertain etiology previously followed by [facility].   She regularly works out in her basement with a [employer]. 06 04 2013: 6-minute walk, 700 feet, 90% on 3 liters, 80% on 3 liters.  03/05/2012, 6-minute walk, 732 feet, 92% on 2 liters 2013 Spirogram reveals an FVC of 75% pre, 77% post, FEV1 89% pre, 91% post, 25-75 237% pre, 287% post consistent with a mild restrictive ventilatory defect and no definite improvement with bronchodilator therapy.  11/2013 PFT [facility] > Ratio 89%, FEV1 0.99 (82% pred, no change with BD), TLC 2.96L (77% pred), DLCO 5.8 (37% pred)  HPI Chief Complaint  Patient presents with  . Follow-up    Pt symptoms unchanged. Still has sob with exhertion, cough with clear mucus. Patient denies wheezing and chest tightness.    07/15/2014 ROV > Katherin says she had a good Christmas and New Years.  She has not had much trouble lately.  She continues to use 3L O2 continuously, and a home health nurse and physical therapist has been measuring her oxygen level at home with exertion and it was OK.  She continues to workout on her [employer] gym.  She was hospitalized recently for a day for a multitude of tests and was told she had CHF.  She has been paying attention to her weight more at home lately since then.  Past Medical History  Diagnosis Date  . Chronic cough 02/2011    CXR with increased interstitial markings [facility] pulmonology  . Psoriasis     elbows  . Hypertension   . Arthritis   . Pelvis fracture 2012  . History of dizziness   . Valvular heart disease   . Cervical disc disease   . Hx of appendectomy   . Hyperlipidemia   . Cancer     skin     Review of Systems  Constitutional: Negative for fever, chills and fatigue.  HENT: Negative for postnasal drip, rhinorrhea and sinus pressure.    Respiratory: Negative for cough, shortness of breath and wheezing.   Cardiovascular: Negative for chest pain, palpitations and leg swelling.       Objective:   Physical Exam Filed Vitals:   07/15/14 1129  BP: 106/70  Pulse: 58  SpO2: 94%   3Lpm, ambulated 540feet on and stayed above 90%  Gen: well appearing, no acute distress HEENT: NCAT, EOMi, OP clear, neck supple without masses PULM:Crackles in bases bilaterally, no wheezing CV: RRR, systolic murmur noted, no JVD AB: BS+, soft, nontender,  Ext: warm, no edema, no clubbing, no cyanosis Derm: no rash or skin breakdown Neuro: A&Ox4, MAEW  May 2013 CT chest reviewed> no pulmonary embolism, there is significant groundglass interstitial changes with some bronchiectasis in the bases. There is no clear honeycombing. The groundglass and interstitial thickening does not seem to be localized to the periphery. The interstitial changes are more significant in the bases of the lungs than in the upper lobes. December 2015 CT angiogram chest [facility]> no pulmonary embolism, there is peripheral interlobular septal thickening and significant bronchiectasis, worse in the bases. There is no clear honeycombing. There is also increased mediastinal lymphadenopathy    Assessment & Plan:   Postinflammatory pulmonary fibrosis I have reviewed the images from the recent CT scan of her chest. Radiology feels that  UIP is possible, which certainly considering the peripheral nature of the findings and her age one should consider UIP. However, her fibrosis has been present for years and her lung function testing has not progressed during that time. Her oxygenation has also remained normal during that time as well as her functional status. At this point I do not feel strongly that she has usual interstitial pneumonitis as this would have likely progressed I this point.  Plan: -Continue monitoring off of anti-fibrotic therapy with every 6 month visits  -continue  oxygen as written  Mediastinal lymphadenopathy This was seen on the CT scan of her chest. This was performed in the setting of a congestive heart failure exacerbation and could be the explanation, though the lymph node was quite large. The only way to know for certain what is going on here would be to obtain a biopsy but that would be quite difficult and she is not interested in that. We discussed various options and she and I decided to keep an eye on it with a repeat CT scan in 3 months and then we will discuss what to do if it has not changed significantly.    Updated Medication List Outpatient Encounter Prescriptions as of 07/15/2014  Medication Sig  . alendronate (FOSAMAX) 70 MG tablet Take 70 mg by mouth once a week.  . bimatoprost (LUMIGAN) 0.03 % ophthalmic solution Place 1 drop into both eyes at bedtime.  . COMBIGAN 0.2-0.5 % ophthalmic solution Place 1 drop into both eyes 2 (two) times daily.  . cyclobenzaprine (FLEXERIL) 5 MG tablet Take 5 mg by mouth 3 (three) times daily as needed.  . diltiazem (DILACOR XR) 180 MG 24 hr capsule Take 180 mg by mouth daily.  . furosemide (LASIX) 40 MG tablet Take 40 mg by mouth every other day.  . ibuprofen (ADVIL,MOTRIN) 400 MG tablet Take 400 mg by mouth 2 (two) times daily as needed.  Marland Kitchen levothyroxine (SYNTHROID, LEVOTHROID) 50 MCG tablet Take 50 mcg by mouth daily before breakfast.  . NON FORMULARY Oxygen 2-4 liter as needed.  Marland Kitchen olmesartan (BENICAR) 40 MG tablet Take 40 mg by mouth daily.  . potassium chloride (MICRO-K) 10 MEQ CR capsule Take 10 mEq by mouth every other day.  . traMADol (ULTRAM) 50 MG tablet Take 50 mg by mouth 2 (two) times daily as needed.

## 2014-07-15 NOTE — Patient Instructions (Signed)
We will arrange a CT  hest in 3 months to evaluate the lymph nodes in your chest Keep using your oxygen as you are doing We will see you back in 3 months

## 2014-10-31 NOTE — Discharge Summary (Signed)
PATIENT NAME:  [name], [name] MR#:  [id number] DATE OF BIRTH:  [birth year]  DATE OF ADMISSION:  06/25/2014 DATE OF DISCHARGE:  06/26/2014  FINAL DIAGNOSES: 1.  Acute on chronic respiratory failure.  2.  Acute on chronic diastolic congestive heart failure as cause of #1 3.  Chronic pulmonary hypertension.  4.  Hypertension.  5.  Osteoarthritis.  6.  History of pelvic fracture.  7.  Tricuspid and mitral valvular heart disease.   HISTORY AND PHYSICAL: Please see dictated admission history and physical.   [place]: The patient was admitted with chest discomfort and shortness of breath with a drop in her oxygen level from her baseline and requiring increasing oxygen supplementation. Chest x-ray revealed pulmonary fibrosis and question of increasing opacities, possible pneumonia or edema. She underwent CT scan, which revealed no evidence of pleural effusions, no report of edema, although she had already diuresed at that point. She was noted to have enlargement and mediastinal lymphadenopathy, possible reactive versus inflammatory, although neoplastic process could not be excluded. She was evaluated by both pulmonology and cardiology. Pulmonology thought that this was mostly due to pulmonary edema and heart failure, and that these nodes were likely reactionary. Anticipation was for the patient to follow up with pulmonary is an outpatient. They did not recommend placing her on long term steroid or antibiotic, although she received doses of both initially. Cardiology saw the patient, they remained concerned that there was underlying pulmonary issues that were contributing to these symptoms.   The patient responded to diuretics as noted above. She ambulated with physical therapy, with drops in her oxygen saturation with ambulation; however, this is consistent with her baseline. Her course is further complicated by varying compliance with both use her oxygen and with using her diuretic, which  she had stopped using with any regularity. The importance of staying on oxygen continuously was stressed to the patient. The importance of using the diuretic at this point was also stress, although side effects were also reviewed.   The patient felt strongly that she could return home, and so will be discharged to home in stable condition with her physical activity to be up as tolerated with a walker with oxygen. She underwent echocardiogram which revealed no changes in her LV function, which remains normal, with evidence of diastolic heart failure and valvular heart disease as noted above. She should weigh herself daily, calling for more than 2 pound gain in 1 day or 5 pounds in 1 week or increasing signs or symptoms of heart failure. She will be set up to follow up in our office in the next 3 to 4 days, and to follow-up with Dr. Lake Bells and [facility] for pulmonary and cardiology expertise, respectively. The importance of keeping these appointments was stressed to the patient. She will be on oxygen 4 liters nasal cannula continuously. She will be on a 2 gram sodium diet.   DISCHARGE MEDICATIONS: 1.  Benicar 40 mg p.o. daily.  2.  Cardizem CD 180 mg p.o. daily. 3.  Levothyroxine 0.05 mg p.o. daily.  4.  Lumigan 0.01% one drop to the effected eye twice a day.  5.  Combigan 0.2/0.5% one drop to the effected eye b.i.d.  6.  Alendronate 70 mg p.o. q. week.  7.  Lasix 40 mg p.o. daily.  8.  Potassium 10 mEq p.o. daily.   Code status discussed with the patient. At this point, she remains FULL code.  ____________________________ Adin Hector, MD [id number] D:  06/26/2014 13:31:56 ET T: 06/26/2014 16:56:29 ET JOB#: [id number]  cc: Tama High III, MD, <Dictator> Juanito Doom, MD Dwayne D. Clayborn Bigness, MD Ramonita Lab MD ELECTRONICALLY SIGNED 07/01/2014 12:24

## 2014-10-31 NOTE — H&P (Signed)
PATIENT NAME:  [name], [name] MR#:  [id number] DATE OF BIRTH:  [birth year]  DATE OF ADMISSION:  06/25/2014  PRIMARY CARE PHYSICIAN:  Ramonita Lab, MD  REFERRING PHYSICIAN:   Dr. Jimmye Norman    CHIEF COMPLAINT: Shortness of breath and chest tightness for 2 weeks, worsening since yesterday.   HISTORY OF PRESENT ILLNESS: An 81 year old Caucasian female with a history of pulmonary fibrosis, hypertension and diastolic congestive heart failure, presented to the ED with the above chief complaint. The patient is alert, awake, oriented, in no acute distress.  The states that she has had chest tightness and shortness of breath for the past 2 weeks, worsening for the past 1 day. The patient also felt generalized weakness, and unable walk to due to dyspnea on exertion. The patient denies any fever or chills.  No headache or dizziness. She denies any orthopnea, nocturnal dyspnea, or leg edema.  The patient is taking Lasix p.r.n. for leg edema.  The patient's chest x-ray showed pulmonary fibrosis, edema, or pneumonia.  The patient is taking oxygen 3 liters at home, but now she is on 4 liters in the ED.   PAST MEDICAL HISTORY: Pulmonary fibrosis, hypertension, osteoarthritis, pelvic fracture, aortic valve heart disease, chronic left diastolic congestive heart failure, pulmonary hypertension and psoriasis.   PAST SURGICAL HISTORY: Hysterectomy, appendectomy, and back surgery.   SOCIAL HISTORY: No smoking, drinking or illicit drugs.   FAMILY HISTORY: Coronary artery disease.   ALLERGIES: LIPITOR, ZOCOR.   HOME MEDICATIONS: Lumigan 0.01% ophthalmic solution 1 drop to each affected eye twice a day, levothyroxine 50 mcg p.o. daily, Lasix 40 mg once a day and Fosamax 70 mg p.o. once a week, Cardizem 180 mg p.o. once a day, Combigan 0.2% to  0.5% ophthalmic solution 1 drop to each affected eye, Benicar 40 mg p.o. daily.     REVIEW OF SYSTEMS:  CONSTITUTIONAL: The patient denies any fever or chills. No headache or  dizziness, but has generalized weakness.  EYES: No double vision or blurry vision.  ENT: No postnasal drip, slurred speech or dysphagia.  CARDIOVASCULAR: Possible chest tiredness, but no chest pain, palpitation, orthopnea, or nocturnal dyspnea. No leg edema.  PULMONARY: Positive for shortness of breath, chest tightness, but no wheezing or hemoptysis. The patient has a mild cough.  GASTROINTESTINAL: No abdominal pain, nausea, vomiting, diarrhea. No melena or bloody stool.  GENITOURINARY: No dysuria, hematuria, or incontinence.  SKIN: No rash or jaundice.  NEUROLOGY: No syncope, loss of consciousness, or seizure.  ENDOCRINOLOGY:  No polyuria, polydipsia, heat or cold intolerance.  HEMATOLOGY: No easy bruising or bleeding.   PHYSICAL EXAMINATION:  VITAL SIGNS: Temperature 98.7, blood pressure 167/84, pulse 72, respirations 26, O2 saturation 91%.  GENERAL: The patient is alert, awake, oriented, in no acute distress.  HEENT: Pupils round, equal, reactive to light and accommodation. Moist oral mucosa. Clear oropharynx.  NECK: Supple. No JVD or carotid bruit. No lymphadenopathy. No thyromegaly,  CARDIOVASCULAR: S1, S2 regular rate and rhythm. No murmurs or gallops.  PULMONARY: Bilateral air entry. Bilateral crackles. No wheezing. No use of accessory muscles to breathe.  ABDOMEN: Soft. No distention or tenderness. No organomegaly. Bowel sounds present.  EXTREMITIES: No edema, clubbing or cyanosis. No calf tenderness. Bilateral pedal pulses present.  SKIN: No rash or jaundice.  NEUROLOGIC: A and o x 3. No focal deficits, power 4/5. Sensation intact.   LABORATORY DATA: ABG showed pH 7.42, PO2 of 64, pCO2 of 50. BNP 2546 and CBC in normal range.  Glucose 104, BUN  15, creatinine 0.91.  Electrolytes normal. Troponin less than 0.02. Chest x-ray showed chronic lung disease with pulmonary fibrosis, progressive lung density bilaterally and diffused, which may represent superimposed edema, or pneumonia.  EKG  showed normal sinus rhythm at 69 bpm.   IMPRESSIONS:  1.  Pulmonary fibrosis flare.  2.  Acute on chronic diastolic congestive heart failure.  3.  Acute on chronic respiratory failure with hypoxia.  4.  Questionable pneumonia.  5.  Hypertension.   PLAN OF TREATMENT:  1.  The patient will be admitted to telemetry floor with telemonitor.  The patient will be given Lasix 20 mg IV b.i.d. and start congestive heart failure protocol.  We will get an echocardiogram and monitor input and output in the body weight.  In addition, we will monitor renal function.  2.  For pulmonary fibrosis flare, I will start prednisone and get a pulmonary consult.  3.  For questionable pneumonia, I will give Levaquin and then follow up with CBC.  It appears less likely, the patient has pneumonia.  We may stop antibiotics if the patient has no fever or leukocytosis.   4.  Acute on chronic failure, we will continue oxygen by nasal cannula.  I gave a steroid and nebulizer treatment.    I discussed the patient's condition and plan of treatment with the patient and the patient's son.   CODE STATUS: The patient wants FULL CODE.   TIME SPENT: About 67 minutes.     ____________________________ Demetrios Loll, MD qc:DT D: 06/25/2014 11:22:50 ET T: 06/25/2014 11:42:23 ET JOB#: [id number]  cc: Demetrios Loll, MD, <Dictator Demetrios Loll MD ELECTRONICALLY SIGNED 06/26/2014 10:40

## 2014-11-01 NOTE — H&P (Signed)
PATIENT NAME:  [name], [name] MR#:  [id number] DATE OF BIRTH:  Dec 15, 1923  DATE OF ADMISSION:  11/16/2011  CHIEF COMPLAINT: Shortness of breath.    HISTORY OF PRESENT ILLNESS: 81 year old female with history of interstitial lung disease, likely pulmonary fibrosis, who was sent to [facility] pulmonology for evaluation towards the end of 2012, unfortunately we have not gotten any results back and the patient really has no clue what they told her at that time. Her pulmonary function tests had shown a restrictive pattern. She developed cough and congestion over the last several days, productive of thick, yellow mucus. No real fevers or chills with this. She has progressive shortness of breath and presented to the office today for evaluation. She didn't appear to be in extreme distress, but she was noted to have saturation of 81% on room air. She states that she follows her oxygen level at home with her own pulse oximetry and her sats typically run 89% to 90% when she is doing well. She denies edema, chest pain. She is admitted now with acute on chronic respiratory failure.   PAST MEDICAL HISTORY:  1. Increased interstitial lung markings consistent with pulmonary fibrosis. Evaluation through [facility] [place], Dr. Saunders Glance, 2012. 2. Psoriasis.  3. Hypertension.  4. Osteoarthritis.  5. History of pelvic fractures.  6. History of left parietal arachnoid cyst by previous MRI. Evaluation through neurosurgery October 2012, no intervention needed.  7. Chronic left-sided diastolic congestive heart failure.  8. Aortic valvular heart disease, with history of moderate to severe aortic sclerosis without stenosis.  9. Pulmonary hypertension with previous cardiology and pulmonology evaluation.  10. Degenerative disk disease with prior back surgery 2007.  11. Status post hysterectomy. 12. Status post appendectomy.   ALLERGIES: Simvastatin and Lipitor caused myalgias.    MEDICATIONS:  1. Benicar 40 mg p.o. daily.  2. Krill  oil 500 mg p.o. daily.  3. Vitamin D3 2000 units p.o. daily.  4. Cardizem CD 120 mg p.o. daily.   SOCIAL HISTORY: No tobacco, but with years of secondhand exposure. Occasional alcohol.   FAMILY HISTORY: Coronary artery disease.   REVIEW OF SYSTEMS: Please see history of present illness. Again, denies fevers, chills. No bowel or bladder changes. No rash. No joint pains. Remainder of complete review of systems is negative.   PHYSICAL EXAMINATION:  VITAL SIGNS: Weight 155, blood pressure 118/72, temperature 98.7, pulse 87, saturation 81% on room air, increasing to 90% on 2 liters.   GENERAL: Pleasant female, in no distress.   EYES: Pupils round and reactive to light. Lids and conjunctivae unremarkable.   EARS, NOSE AND THROAT: External examination unremarkable. The oropharynx is moist without lesion.  NECK: Supple, trachea midline. No thyromegaly.   CARDIOVASCULAR: Regular rate and rhythm without murmurs, gallops, rubs. Carotids and radial pulses 2+.   LUNGS: Crackles are heard throughout the lung fields with inspiratory and expiratory wheeze bilaterally. No retractions.   ABDOMEN: Soft, nontender, positive bowel sounds. No guard, rebound.   SKIN: No significant rashes or nodules other than psoriatic changes, chronic.   LYMPH NODES: No cervical or supraclavicular nodes.   MUSCULOSKELETAL: No clubbing, cyanosis, or edema.   NEUROLOGIC: Cranial nerves are intact. Has a mildly ataxic gait at baseline. May have some short-term memory deficits.   IMPRESSION AND PLAN:  1. Acute on chronic respiratory failure. Patient appears has some underlying pulmonary fibrosis, but may actually have some underlying bronchospasm or chronic obstructive pulmonary disease in addition. Will place on SVNs, add Advair, steroids and place on  Levaquin. Check blood cultures and sputum culture, chest x-ray and will get pulmonary consultation. She may need CT as suspected chest x-ray will be difficult to interpret.   2. Hypertension, controlled with use of Cardizem and her blood pressure has been too low to place her on beta blocker in the past. Will continue her current medications for now.  3. Chronic left-sided diastolic congestive heart failure. No evidence of heart failure currently. Will get EKG and check troponins, however.  4. Degenerative disease. Tylenol as needed. Try to avoid anti-inflammatories.   ____________________________ Adin Hector, MD bjk:cms D: 11/16/2011 17:19:34 ET T: 11/16/2011 17:41:03 ET JOB#: [id number]  cc: Adin Hector, MD, <Dictator> Ramonita Lab MD ELECTRONICALLY SIGNED 11/22/2011 7:30

## 2014-11-01 NOTE — Discharge Summary (Signed)
PATIENT NAME:  [name], [name] MR#:  [id number] DATE OF BIRTH:  05-18-1924  DATE OF ADMISSION:  11/16/2011 DATE OF DISCHARGE:  11/20/2011  FINAL DIAGNOSES:  1. Acute on chronic respiratory failure secondary to underlying pneumonitis.  2. Pulmonary fibrosis versus interstitial lung disease.  3. Psoriasis.  4. Hypertension.  5. Osteoarthritis.  6. History of pelvic fractures.  7. Left parietal arachnoid cyst.  8. Chronic left-sided diastolic congestive heart failure.  9. Severe aortic sclerosis without stenosis by prior echocardiogram.  10. Pulmonary hypertension with previous Cardiology and Pulmonology evaluation.  11. Degenerative disk disease.   HISTORY AND PHYSICAL: Please see dictated admission history and physical.   [address]: The patient was admitted from the office with increasing difficulty breathing, saturation 81% on room air, though she really did not have a great deal of distress despite her low oxygen level. She was placed on antibiotics, steroids, and inhaled medications after examination revealed worsening coarseness of her breath sounds as well as diffuse wheezing. She underwent CT scan on 11/17/2011 which was performed without contrast secondary to chronic kidney disease stage III and this revealed extensive pulmonary fibrosis most prominent in the mid and lower lung zones with bronchiectasis and ground-glass areas of attenuation which were thought to be consistent with COPD versus scattered pneumonitis versus possible edema. She was also noted to have reactive mediastinal adenopathy and cardiomegaly.   She was seen by Pulmonology. She was maintained on steroids and antibiotics and her overall course was quite positive. She was up ambulating and went over 150 feet with oxygen. On the morning of discharge her saturation was 92% at rest in bed, however, immediately dropped to 85% with the active standing up at the bedside and so she will need home oxygen 2 liters nasal  cannula continuously. She has been recently evaluated at [facility] and is due to go back to see Dr. Francetta Found and we will arrange this appointment as well. Her medications were adjusted for blood pressure control. Beta-blockers were avoided secondary to her bronchospasm and she was placed back on a lower dose of her ARB.   At this time she will be discharged home in stable condition with her physical activity to be up as tolerated. She will be on a 2 gram sodium diet. It is recommended that she weigh herself daily, calling for more than 2 pound gain in one day or 5 pounds in one week or increasing signs or symptoms of heart failure with which she is familiar. We will anticipate her following up in our office within the next one week. Home health nursing was ordered for the patient as she does appear to have some short-term memory deficits and likely has some mild underlying dementia.   DISCHARGE MEDICATIONS:  1. Vitamin D 2000 units p.o. daily.  2. Krill Oil 500 mg p.o. daily.  3. Tessalon Perles 100 mg p.o. q.6 hours p.r.n. cough.  4. Cardizem CD 180 mg p.o. daily.  5. Omeprazole 20 mg daily as needed, which she should take daily while she is on prednisone.  6. Advair 250/50 one puff b.i.d.  7. Prednisone taper starting at 50 mg daily, decreasing by 10 mg every two days.  8. Levaquin 250 mg p.o. daily x8 days to complete a 10-day course.  9. Spiriva 1 capsule inhaled daily.  10. Albuterol MDI 2 puffs t.i.d.   11. Benicar 20 mg p.o. daily.   ____________________________ Adin Hector, MD bjk:drc D: 11/20/2011 07:49:39 ET T: 11/21/2011  93:71:69 ET JOB#: [id number]  cc: Tama High III, MD, <Dictator> Herbon E. Raul Del, MD Dr. Francetta Found, [facility] Pulmonology  Ramonita Lab MD ELECTRONICALLY SIGNED 11/22/2011 7:30

## 2014-11-04 NOTE — Consult Note (Signed)
PATIENT NAME:  [name], [name] MR#:  [id number] DATE OF BIRTH:  11/30/1923  DATE OF CONSULTATION:  06/25/2014  REFERRING PHYSICIAN:   CONSULTING PHYSICIAN:  Dwayne D. Clayborn Bigness, MD  PRIMARY PHYSICIAN: Tama High III, MD.  INDICATION: Shortness of breath, chest tightness.   HISTORY OF PRESENT ILLNESS: The patient is 81 year old white female with a history of pulmonary fibrosis, hypertension, diastolic congestive heart failure, presented to the Emergency Room with chest congestion over the last 2 weeks, but worse the night prior to admission. The patient is awake and alert, no acute distress, but complained of shortness of breath and congestion.  She also had chest tightness and mild sputum production. The patient complained of generalized weakness. She has been slowing down and not able to do much because of dyspnea on exertion and mild leg edema. The patient was taking Lasix as necessary for pain for swelling. Chest x-ray just shows pulmonary fibrosis, which she has known for about 10 years, some wheezing and edema. The patient is on oxygen therapy with some relief, but continued to feel poorly, so finally presented for further evaluation.   PAST MEDICAL HISTORY: Pulmonary fibrosis, hypertension, osteoarthritis, pelvic fracture,  left diastolic congestive heart failure, pulmonary, psoriasis,  shortness of breath.   PAST SURGICAL HISTORY: Hysterectomy, appendectomy, back surgery.   SOCIAL HISTORY: No smoking. No drinking. No alcohol consumption, involved with second-hand smoke.   FAMILY HISTORY: Coronary artery disease.   ALLERGIES: LIPITOR, ZOCOR.  HOME MEDICATIONS: Lumigan eyedrops, levothyroxine 50 mcg a day, Lasix 40 mg a day, Fosamax 70 mg a week, Cardizem 180 mg once a day.   REVIEW OF SYSTEMS:  CONSTITUTIONAL:  No blackout spells or syncope. No nausea or vomiting. No fever, chills, or sweats.  GASTROINTESTINAL: No weight gain, hemoptysis, or hematemesis. No bright red blood per rectum   splenic flexure.  RESPIRATORY: A  no cough. She has had dyspnea, shortness of breath symptoms, rhonchi.   PHYSICAL EXAMINATION: VITAL SIGNS: Blood pressure 160/80, pulse 70, respiratory rate of 26, saturations 90%   HEENT: Normocephalic, atraumatic. Pupils equal and reactive to light.  NECK: Supple. No significant JVD, bruits, or adenopathy.  LUNGS: Clear to auscultation and percussion, except for bilateral rhonchi, mild occasional wheezing, congestion, hypertension.  CARDIOVASCULAR: Regular rate and rhythm.  ABDOMEN: Benign. Positive bowel sounds. No rebound, guarding or tenderness.  EXTREMITIES: Within normal limits.  NEUROLOGIC: Intact.  SKIN: Normal.   LABORATORY DATA: ABG: pH 7.42, 64 O2, pCO2 of 50. BNP 2546. CBC normal. Glucose 104, BUN 15, creatinine 0.91. Electrolytes were normal. Troponin 0.02.   RADIOGRAPHIC DATA Chest x-ray showed chronic lung disease, pulmonary fibrosis, progressive lung densities bilaterally superimposed on anemia and pneumonia. Chest EKG showed normal sinus rhythm at about 70, nonspecific findings.   ASSESSMENT: Pulmonary fibrosis with worsening flare, chronic obstructive pulmonary disease, acute on chronic diastolic congestive heart failure, acute on chronic respiratory failure with hypoxemia, possible pneumonia, hypertension, hypoxemia.   PLAN: 1. Recommend cardiac evaluation, placed on telemetry. Continue Lasix therapy twice a day to help with shortness of breath symptoms. Recommend echocardiogram for further evaluation and management to evaluate for heart failure.  2. Continue treatment for pulmonary flare, including supplemental oxygen, inhalers. Consider steroid therapy. Recommend pulmonary consult.  3. For possible pneumonia, consider Levaquin antibiotic therapy. 4. Recommend continue current therapy for shortness of breath and congestion. Consider ENT.  5. The patient is now feeling slightly better on oxygen. No blackout spells or syncope. We will  continue cardiac work-up. Follow up  cardiac enzymes, borderline troponins.   ____________________________ Loran Senters Clayborn Bigness, MD ddc:mw D: 06/25/2014 15:10:31 ET T: 06/25/2014 16:50:28 ET JOB#: [id number]  cc: Dwayne D. Clayborn Bigness, MD, <Dictator> Yolonda Kida MD ELECTRONICALLY SIGNED 07/17/2014 22:06

## 2015-03-12 ENCOUNTER — Encounter: Payer: Self-pay | Admitting: Internal Medicine

## 2015-03-12 ENCOUNTER — Ambulatory Visit (INDEPENDENT_AMBULATORY_CARE_PROVIDER_SITE_OTHER): Payer: Medicare HMO | Admitting: Internal Medicine

## 2015-03-12 VITALS — BP 124/78 | HR 71 | Ht 60.0 in | Wt 145.0 lb

## 2015-03-12 DIAGNOSIS — J8410 Pulmonary fibrosis, unspecified: Secondary | ICD-10-CM | POA: Diagnosis not present

## 2015-03-12 NOTE — Patient Instructions (Signed)
Idiopathic Pulmonary Fibrosis Idiopathic pulmonary fibrosis is an inflammation (soreness and irritation) in the lungs which eventually causes scarring. This usually shows in middle age over a several year time period. The cause is unknown (idiopathic). Usually death occurs after several years but there are no time tables which will predict perfectly what the course of the illness will be. It affects males and females equally. In this condition there is a formation of fibrous (tough leathery) tissue in the small ducts which carry air to and from your lungs. Because of this, the lungs do not work as well as they should for taking in oxygen from the air you breathe and getting rid of wastes (carbon dioxide). Because the lungs are damaged, there may be more problems with infections or the heart.  Other problems may include pulmonary hypertension (high blood pressure in the lungs), and formation of blood clots. Some symptoms of this illness are:  Coughing and breathing difficulties; cough is usually dry and hacking.  Bluish (cyanotic) skin and lips due to lack of circulating oxygen.  Loss of appetite.  Loss of strength which comes from just the increased work of breathing.  Rapid, shallow breathing occur with moderate exercise and later even while resting.  Increasing shortness of breath (dyspnea) which progresses as the disease gets worse.  Weight loss and fatigue due partly to the increased work of breathing.  Clubbing of the fingers (the ends of the fingers become rounded and enlarged). DIAGNOSIS  A diagnosis of idiopathic pulmonary fibrosis may be suspected based on exam and a patient's history.  Specialized X-rays, pulmonary function tests, pulse oximetry, and laboratory tests including blood gasses help confirm the problem.  Sometimes a biopsy is done and a small piece of lung tissue is removed. This may be done through a bronchoscope or during an operation in which the chest is opened. This  is looked at under a microscope by a specialist who can tell what the lung problem is. CAUSES If the cause of pulmonary fibrosis is known, it is no longer known as idiopathic. Several causes of pulmonary fibrosis include:  Occupational and environmental exposures to asbestos, silica, and or metal dusts.  Illegal or street drug use.  Agricultural workers may inhale substances, such as moldy hay, which can cause an allergic reaction in the lung. This reaction is called Farmer's Lung and can cause pulmonary fibrosis. Some other fumes found on farms are directly toxic to the lungs.  Exposure of the lungs to radiation.  Collagen diseases.  Sarcoidosis is a disease which forms granulomas (areas of inflammatory cells), which can attack any area of the body but most frequently affects the lungs.  Drugs. Certain medicines may have the undesirable side effect of causing pulmonary fibrosis. Check with your doctor about the medicines you are taking and ask about any possible side effects.  Some cases of pulmonary fibrosis seem to be genetic. TREATMENT   There are no drugs currently approved for the treatment of pulmonary fibrosis. Steroids (a potent medication which cuts down on inflammation) are sometimes given to prevent lung changes before they become permanent. High doses may be recommended at first, followed by lower maintenance dosages. Other medications may be tried if steroids do not work.  Lung disease may be monitored with X-rays and laboratory work.  Oxygen may be helpful if oxygen in the blood is diminished. This improves the quality of life. Your caregiver will give you a prescription for this if it is helpful.  Antibiotics are used   for treatment of infections.  Exercise may be beneficial.  Lung transplants are being investigated and a single lung transplant may be considered for some patients.  Influenza vaccine and pneumococcal pneumonia vaccine are both recommended for people  with IPF or any lung disease. These two shots may help keep you healthy. Document Released: 09/16/2003 Document Revised: 09/18/2011 Document Reviewed: 06/26/2005 Exit[employer] Patient Information 2015 [employer]. This information is not intended to replace advice given to you by your health care provider. Make sure you discuss any questions you have with your health care provider.  

## 2015-03-12 NOTE — Progress Notes (Signed)
Subjective:    Patient ID: [name], female    DOB: 07-17-1923, 81 y.o.   MRN: [id number]  Synopsis: First seen by [facility] in 08/7060 for ILD of uncertain etiology previously followed by [facility].   She regularly works out in her basement with a [employer]. 06 04 2013: 6-minute walk, 700 feet, 90% on 3 liters, 80% on 3 liters.  03/05/2012, 6-minute walk, 732 feet, 92% on 2 liters 2013 Spirogram reveals an FVC of 75% pre, 77% post, FEV1 89% pre, 91% post, 25-75 237% pre, 287% post consistent with a mild restrictive ventilatory defect and no definite improvement with bronchodilator therapy.  11/2013 PFT [facility] > Ratio 89%, FEV1 0.99 (82% pred, no change with BD), TLC 2.96L (77% pred), DLCO 5.8 (37% pred)  HPI Chief Complaint  Patient presents with  . Follow-up    SOB w/activity; cough;     PATIENT HAS BEEN DOING WELL WITH HER BREATHING FOR LAST SEVERAL MONTHS CT CHEST REVEALED ILD FINDINGS BUT I AM NOT ABLE TO SEE THE FILMS  I HAVE HAD A LONG DISCUSSION WITH PATIENT REGARDING GETTING PFTS AND CT CHEST TO FOLLOW UP ADENOPATHY-PATIENT HAS DECIDED THAT SHE WILL NOT PURSUE ANY FURTHER TESTING  SHE DOES NOT WANT ANY INVASIVE PROCEDURES AT THIS TIME  PATIENT HAS OXYGEN SATS IN THE 70'S BUT DID NOT HAVE ANY SIGNS OF CYANOSIS OR BREATHING DIFFICULTY PATIENT ONLY COMPLAINT IS [name] HAS BEEN STABLE OVER LAST 6 MONTHS   Past Medical History  Diagnosis Date  . Chronic cough 02/2011    CXR with increased interstitial markings [facility] pulmonology  . Psoriasis     elbows  . Hypertension   . Arthritis   . Pelvis fracture 2012  . History of dizziness   . Valvular heart disease   . Cervical disc disease   . Hx of appendectomy   . Hyperlipidemia   . Cancer     skin     Review of Systems  Constitutional: Negative for fever, chills and fatigue.  HENT: Negative for postnasal drip, rhinorrhea and sinus pressure.   Respiratory: Negative for cough,  shortness of breath and wheezing.   Cardiovascular: Negative for chest pain, palpitations and leg swelling.       Objective:   Physical Exam Filed Vitals:   03/12/15 1452  BP: 124/78  Pulse: 71  Height: 5' (1.524 m)  Weight: 145 lb (65.772 kg)  SpO2: 92%     Gen: well appearing, no acute distress HEENT: NCAT, EOMi, OP clear, neck supple without masses PULM:FINE Crackles in bases bilaterally, no wheezing CV: RRR, systolic murmur noted, no JVD AB: BS+, soft, nontender,  Ext: warm, no edema, no clubbing, no cyanosis Derm: no rash or skin breakdown Neuro: A&Ox4,    May 2013 CT chest reviewed> no pulmonary embolism, there is significant groundglass interstitial changes with some bronchiectasis in the bases. There is no clear honeycombing. The groundglass and interstitial thickening does not seem to be localized to the periphery. The interstitial changes are more significant in the bases of the lungs than in the upper lobes. December 2015 CT angiogram chest [facility]> no pulmonary embolism, there is peripheral interlobular septal thickening and significant bronchiectasis, worse in the bases. There is no clear honeycombing. There is also increased mediastinal lymphadenopathy    Assessment & Plan:   81 yo pleasant white female with clinical signs and symptoms of END STAGE PULMONARY FIBROSIS with Chronic Oxygen Therapy   Postinflammatory pulmonary  fibrosis Plan: -Continue monitoring off of anti-fibrotic therapy with every 6 month visits  -continue oxygen as needed  Mediastinal lymphadenopathy -I have discussed these findings with Patient and has decided not to Pursue any further work up at this time -Will NOT obtain CT chest unless patient wants to pursue further diagnosis  Will need to Discuss EOL issues at next visit  I have personally obtained a history, examined the patient, evaluated Pertinent laboratory and RadioGraphic/imaging results, and  formulated the assessment and  plan   The Patient requires high complexity decision making for assessment and support, frequent evaluation and titration of therapies. Time Spent with patient 30 mins   Elice Crigger Patricia Pesa, M.D.  [facility]  Medical Director [place] Director [facility] Cardio-Pulmonary Department

## 2015-06-07 ENCOUNTER — Other Ambulatory Visit: Payer: Self-pay | Admitting: Unknown Physician Specialty

## 2015-06-07 DIAGNOSIS — M545 Low back pain: Secondary | ICD-10-CM

## 2015-06-07 DIAGNOSIS — M25551 Pain in right hip: Secondary | ICD-10-CM

## 2015-06-28 ENCOUNTER — Ambulatory Visit: Payer: Medicare HMO

## 2015-07-19 ENCOUNTER — Ambulatory Visit: Payer: Medicare HMO

## 2015-08-08 ENCOUNTER — Inpatient Hospital Stay
Admission: EM | Admit: 2015-08-08 | Discharge: 2015-08-11 | DRG: 196 | Disposition: A | Discharge status: Home Health | Payer: Medicare HMO | Attending: Internal Medicine | Admitting: Internal Medicine

## 2015-08-08 ENCOUNTER — Emergency Department: Payer: Medicare HMO

## 2015-08-08 ENCOUNTER — Encounter: Payer: Self-pay | Admitting: Medical Oncology

## 2015-08-08 DIAGNOSIS — R0902 Hypoxemia: Secondary | ICD-10-CM | POA: Diagnosis not present

## 2015-08-08 DIAGNOSIS — I959 Hypotension, unspecified: Secondary | ICD-10-CM | POA: Diagnosis present

## 2015-08-08 DIAGNOSIS — Z7982 Long term (current) use of aspirin: Secondary | ICD-10-CM

## 2015-08-08 DIAGNOSIS — E785 Hyperlipidemia, unspecified: Secondary | ICD-10-CM | POA: Diagnosis present

## 2015-08-08 DIAGNOSIS — Z85828 Personal history of other malignant neoplasm of skin: Secondary | ICD-10-CM | POA: Diagnosis not present

## 2015-08-08 DIAGNOSIS — M1990 Unspecified osteoarthritis, unspecified site: Secondary | ICD-10-CM | POA: Diagnosis present

## 2015-08-08 DIAGNOSIS — I480 Paroxysmal atrial fibrillation: Secondary | ICD-10-CM | POA: Diagnosis present

## 2015-08-08 DIAGNOSIS — J449 Chronic obstructive pulmonary disease, unspecified: Secondary | ICD-10-CM | POA: Diagnosis present

## 2015-08-08 DIAGNOSIS — E039 Hypothyroidism, unspecified: Secondary | ICD-10-CM | POA: Diagnosis present

## 2015-08-08 DIAGNOSIS — Z888 Allergy status to other drugs, medicaments and biological substances status: Secondary | ICD-10-CM | POA: Diagnosis not present

## 2015-08-08 DIAGNOSIS — Z9981 Dependence on supplemental oxygen: Secondary | ICD-10-CM | POA: Diagnosis not present

## 2015-08-08 DIAGNOSIS — L409 Psoriasis, unspecified: Secondary | ICD-10-CM | POA: Diagnosis present

## 2015-08-08 DIAGNOSIS — I10 Essential (primary) hypertension: Secondary | ICD-10-CM | POA: Diagnosis present

## 2015-08-08 DIAGNOSIS — J8410 Pulmonary fibrosis, unspecified: Principal | ICD-10-CM | POA: Diagnosis present

## 2015-08-08 DIAGNOSIS — I509 Heart failure, unspecified: Secondary | ICD-10-CM

## 2015-08-08 DIAGNOSIS — J9621 Acute and chronic respiratory failure with hypoxia: Secondary | ICD-10-CM | POA: Diagnosis present

## 2015-08-08 DIAGNOSIS — Z79899 Other long term (current) drug therapy: Secondary | ICD-10-CM | POA: Diagnosis not present

## 2015-08-08 DIAGNOSIS — J9611 Chronic respiratory failure with hypoxia: Secondary | ICD-10-CM | POA: Diagnosis not present

## 2015-08-08 HISTORY — DX: Pulmonary fibrosis, unspecified: J84.10

## 2015-08-08 LAB — CBC WITH DIFFERENTIAL/PLATELET
BASOS ABS: 0.1 10*3/uL (ref 0–0.1)
BASOS PCT: 1 %
Eosinophils Absolute: 0.3 10*3/uL (ref 0–0.7)
Eosinophils Relative: 5 %
HEMATOCRIT: 40.8 % (ref 35.0–47.0)
HEMOGLOBIN: 13.2 g/dL (ref 12.0–16.0)
Lymphocytes Relative: 14 %
Lymphs Abs: 0.9 10*3/uL — ABNORMAL LOW (ref 1.0–3.6)
MCH: 30.3 pg (ref 26.0–34.0)
MCHC: 32.3 g/dL (ref 32.0–36.0)
MCV: 93.6 fL (ref 80.0–100.0)
Monocytes Absolute: 0.5 10*3/uL (ref 0.2–0.9)
Monocytes Relative: 8 %
NEUTROS ABS: 4.9 10*3/uL (ref 1.4–6.5)
NEUTROS PCT: 72 %
Platelets: 194 10*3/uL (ref 150–440)
RBC: 4.36 MIL/uL (ref 3.80–5.20)
RDW: 15.9 % — AB (ref 11.5–14.5)
WBC: 6.7 10*3/uL (ref 3.6–11.0)

## 2015-08-08 LAB — TROPONIN I
TROPONIN I: 0.06 ng/mL — AB (ref <0.031)
TROPONIN I: 0.07 ng/mL — AB (ref <0.031)
Troponin I: 0.07 ng/mL — ABNORMAL HIGH (ref <0.031)

## 2015-08-08 LAB — BASIC METABOLIC PANEL
ANION GAP: 11 (ref 5–15)
BUN: 15 mg/dL (ref 6–20)
CALCIUM: 9 mg/dL (ref 8.9–10.3)
CHLORIDE: 103 mmol/L (ref 101–111)
CO2: 30 mmol/L (ref 22–32)
Creatinine, Ser: 1.09 mg/dL — ABNORMAL HIGH (ref 0.44–1.00)
GFR calc non Af Amer: 44 mL/min — ABNORMAL LOW (ref >60)
GFR, EST AFRICAN AMERICAN: 51 mL/min — AB (ref >60)
Glucose, Bld: 103 mg/dL — ABNORMAL HIGH (ref 65–99)
Potassium: 4.1 mmol/L (ref 3.5–5.1)
Sodium: 144 mmol/L (ref 135–145)

## 2015-08-08 LAB — BRAIN NATRIURETIC PEPTIDE: B NATRIURETIC PEPTIDE 5: 998 pg/mL — AB (ref 0.0–100.0)

## 2015-08-08 MED ORDER — POTASSIUM CHLORIDE CRYS ER 10 MEQ PO TBCR
10.0000 meq | EXTENDED_RELEASE_TABLET | ORAL | Status: DC
  Administered 2015-08-10: 10 meq via ORAL
  Filled 2015-08-08: qty 1

## 2015-08-08 MED ORDER — ALENDRONATE SODIUM 70 MG PO TABS: 70.0000 mg | ORAL_TABLET | ORAL | Status: DC

## 2015-08-08 MED ORDER — HYDRALAZINE HCL 20 MG/ML IJ SOLN
10.0000 mg | Freq: Once | INTRAMUSCULAR | Status: AC
  Administered 2015-08-08: 10 mg via INTRAVENOUS
  Filled 2015-08-08: qty 1

## 2015-08-08 MED ORDER — TIMOLOL MALEATE 0.5 % OP SOLN
1.0000 [drp] | Freq: Two times a day (BID) | OPHTHALMIC | Status: DC
  Administered 2015-08-11: 1 [drp] via OPHTHALMIC
  Filled 2015-08-08: qty 5

## 2015-08-08 MED ORDER — ENOXAPARIN SODIUM 30 MG/0.3ML ~~LOC~~ SOLN
30.0000 mg | SUBCUTANEOUS | Status: DC
  Administered 2015-08-08: 30 mg via SUBCUTANEOUS
  Filled 2015-08-08: qty 0.3

## 2015-08-08 MED ORDER — BRIMONIDINE TARTRATE-TIMOLOL 0.2-0.5 % OP SOLN: 1.0000 [drp] | Freq: Two times a day (BID) | OPHTHALMIC | Status: DC

## 2015-08-08 MED ORDER — BRIMONIDINE TARTRATE 0.2 % OP SOLN
1.0000 [drp] | Freq: Two times a day (BID) | OPHTHALMIC | Status: DC
  Administered 2015-08-11: 1 [drp] via OPHTHALMIC
  Filled 2015-08-08: qty 5

## 2015-08-08 MED ORDER — LATANOPROST 0.005 % OP SOLN
1.0000 [drp] | Freq: Every day | OPHTHALMIC | Status: DC
  Filled 2015-08-08: qty 2.5

## 2015-08-08 MED ORDER — ACETAMINOPHEN 650 MG RE SUPP: 650.0000 mg | Freq: Four times a day (QID) | RECTAL | Status: DC | PRN

## 2015-08-08 MED ORDER — PREDNISONE 50 MG PO TABS
50.0000 mg | ORAL_TABLET | Freq: Every day | ORAL | Status: DC
  Administered 2015-08-09 – 2015-08-10 (×2): 50 mg via ORAL
  Filled 2015-08-08 (×2): qty 1

## 2015-08-08 MED ORDER — DILTIAZEM HCL ER COATED BEADS 180 MG PO CP24
180.0000 mg | ORAL_CAPSULE | Freq: Every day | ORAL | Status: DC
  Administered 2015-08-09 – 2015-08-11 (×3): 180 mg via ORAL
  Filled 2015-08-08 (×4): qty 1

## 2015-08-08 MED ORDER — IPRATROPIUM-ALBUTEROL 0.5-2.5 (3) MG/3ML IN SOLN
3.0000 mL | Freq: Four times a day (QID) | RESPIRATORY_TRACT | Status: DC
  Administered 2015-08-09 – 2015-08-10 (×7): 3 mL via RESPIRATORY_TRACT
  Filled 2015-08-08 (×8): qty 3

## 2015-08-08 MED ORDER — LEVOTHYROXINE SODIUM 50 MCG PO TABS
50.0000 ug | ORAL_TABLET | Freq: Every day | ORAL | Status: DC
  Administered 2015-08-09 – 2015-08-11 (×3): 50 ug via ORAL
  Filled 2015-08-08 (×3): qty 1

## 2015-08-08 MED ORDER — ASPIRIN 81 MG PO CHEW
324.0000 mg | CHEWABLE_TABLET | Freq: Once | ORAL | Status: AC
Start: 2015-08-08 — End: 2015-08-08
  Administered 2015-08-08: 324 mg via ORAL
  Filled 2015-08-08: qty 4

## 2015-08-08 MED ORDER — DOCUSATE SODIUM 100 MG PO CAPS
100.0000 mg | ORAL_CAPSULE | Freq: Two times a day (BID) | ORAL | Status: DC
  Administered 2015-08-08 – 2015-08-11 (×6): 100 mg via ORAL
  Filled 2015-08-08 (×6): qty 1

## 2015-08-08 MED ORDER — ACETAMINOPHEN 325 MG PO TABS
650.0000 mg | ORAL_TABLET | Freq: Four times a day (QID) | ORAL | Status: DC | PRN
Start: 2015-08-08 — End: 2015-08-11

## 2015-08-08 MED ORDER — HYDRALAZINE HCL 20 MG/ML IJ SOLN
10.0000 mg | Freq: Four times a day (QID) | INTRAMUSCULAR | Status: DC | PRN
  Administered 2015-08-08 – 2015-08-11 (×2): 10 mg via INTRAVENOUS
  Filled 2015-08-08 (×2): qty 1

## 2015-08-08 MED ORDER — IRBESARTAN 75 MG PO TABS
300.0000 mg | ORAL_TABLET | Freq: Every day | ORAL | Status: DC
  Filled 2015-08-08: qty 2

## 2015-08-08 MED ORDER — FUROSEMIDE 40 MG PO TABS: 40.0000 mg | ORAL_TABLET | ORAL | Status: DC

## 2015-08-08 MED ORDER — FUROSEMIDE 10 MG/ML IJ SOLN
20.0000 mg | Freq: Once | INTRAMUSCULAR | Status: AC
  Administered 2015-08-08: 20 mg via INTRAVENOUS
  Filled 2015-08-08: qty 4

## 2015-08-08 MED ORDER — SODIUM CHLORIDE 0.9% FLUSH
3.0000 mL | Freq: Two times a day (BID) | INTRAVENOUS | Status: DC
  Administered 2015-08-08 – 2015-08-10 (×6): 3 mL via INTRAVENOUS

## 2015-08-08 NOTE — Progress Notes (Signed)
Rn notified of critically high troponin. MD notified, no new orders. Will continue to assess.

## 2015-08-08 NOTE — H&P (Signed)
[place] [facility] [place] NAME: [name]    MR#:  [id number]  DATE OF BIRTH:  03/27/1924  DATE OF ADMISSION:  08/08/2015  PRIMARY CARE PHYSICIAN: Adin Hector, MD   REQUESTING/REFERRING PHYSICIAN: Dr. Larae Grooms  CHIEF COMPLAINT:   Chief Complaint  Patient presents with  . Shortness of Breath    HISTORY OF PRESENT ILLNESS:  [name]  is a 82 y.o. female with a known history of hypertension, arthritis, end-stage pulmonary fibrosis on 4 L chronic home oxygen presents to the hospital secondary to difficulty breathing and generalized weakness for 2 days now. Patient denies any fevers or chills. She has oxygen concentrator at home but feels like it hasn't been giving her enough oxygen and feels like it's broken. For the last few days she's been using her portable oxygen and has been feeling extremely short of breath, worsened wheezing. Her cough is at baseline. Nonproductive and dry cough. Denies any nausea vomiting or diarrhea. No exposure to sick contacts or no recent travel. She follows up with [facility] pulmonology has been taken off of anti-fibrotic therapy. Now she is on 6 L oxygen and she was noted to be hypoxic on presentation. Chest x-ray with no acute findings.  PAST MEDICAL HISTORY:   Past Medical History  Diagnosis Date  . Chronic cough 02/2011    CXR with increased interstitial markings [facility] pulmonology  . Psoriasis     elbows  . Hypertension   . Arthritis   . Pelvis fracture ([place]) 2012  . History of dizziness   . Valvular heart disease   . Cervical disc disease   . Hx of appendectomy   . Hyperlipidemia   . Cancer ([place])     skin  . Pulmonary fibrosis ([place])     on 4L home oxygen    PAST SURGICAL HISTORY:   Past Surgical History  Procedure Laterality Date  . Back surgery  2007  . Total abdominal hysterectomy w/ bilateral salpingoophorectomy    . Skin cancer excision    . Cardiac catheterization  2006  .  Cataract surgery      SOCIAL HISTORY:   Social History  Substance Use Topics  . Smoking status: Never Smoker   . Smokeless tobacco: Never Used     Comment: exposed to second hand smoke  . Alcohol Use: 0.0 oz/week    0 Standard drinks or equivalent per week     Comment: occasional wine    FAMILY HISTORY:   Family History  Problem Relation Age of Onset  . CAD Father   . CAD Mother     DRUG ALLERGIES:   Allergies  Allergen Reactions  . Lipitor [Atorvastatin] Nausea And Vomiting  . Simvastatin Nausea And Vomiting    REVIEW OF SYSTEMS:   Review of Systems  Constitutional: Positive for malaise/fatigue. Negative for fever, chills and weight loss.  HENT: Negative for ear discharge, ear pain, hearing loss, nosebleeds and tinnitus.   Eyes: Negative for blurred vision, double vision and photophobia.  Respiratory: Positive for cough, shortness of breath and wheezing. Negative for hemoptysis.   Cardiovascular: Negative for chest pain, palpitations, orthopnea and leg swelling.  Gastrointestinal: Negative for heartburn, nausea, vomiting, abdominal pain, diarrhea, constipation and melena.  Genitourinary: Negative for dysuria, urgency, frequency and hematuria.  Musculoskeletal: Negative for myalgias, back pain and neck pain.  Skin: Negative for rash.  Neurological: Positive for weakness. Negative for dizziness, tremors, sensory change, speech change, focal  weakness and headaches.  Endo/Heme/Allergies: Does not bruise/bleed easily.  Psychiatric/Behavioral: Negative for depression.    MEDICATIONS AT HOME:   Prior to Admission medications   Medication Sig Start Date End Date Taking? Authorizing Provider  alendronate (FOSAMAX) 70 MG tablet Take 70 mg by mouth once a week. Take on Friday. 07/07/14  Yes Historical Provider, MD  aspirin EC 81 MG tablet Take 81 mg by mouth daily.   Yes Historical Provider, MD  azelastine (ASTELIN) 0.1 % nasal spray Place 1 spray into both nostrils 2  (two) times daily as needed. For congestion. 05/12/15  Yes Historical Provider, MD  bimatoprost (LUMIGAN) 0.03 % ophthalmic solution Place 1 drop into both eyes 2 (two) times daily.    Yes Historical Provider, MD  clobetasol cream (TEMOVATE) [id number] % Apply 1 application topically 2 (two) times daily as needed. For psoriasis. 05/12/15  Yes Historical Provider, MD  COMBIGAN 0.2-0.5 % ophthalmic solution Place 1 drop into both eyes 2 (two) times daily. 06/22/14  Yes Historical Provider, MD  diltiazem (CARDIZEM CD) 180 MG 24 hr capsule Take 180 mg by mouth daily. 06/27/15  Yes Historical Provider, MD  furosemide (LASIX) 40 MG tablet Take 20 mg by mouth every other day.    Yes Historical Provider, MD  levothyroxine (SYNTHROID, LEVOTHROID) 50 MCG tablet Take 50 mcg by mouth daily before breakfast.   Yes Historical Provider, MD  olmesartan (BENICAR) 40 MG tablet Take 40 mg by mouth daily.   Yes Historical Provider, MD  OXYGEN Inhale 4 L into the lungs continuous.   Yes Historical Provider, MD  potassium chloride (MICRO-K) 10 MEQ CR capsule Take 10 mEq by mouth every other day. 06/26/14  Yes Historical Provider, MD      VITAL SIGNS:  Blood pressure 139/74, pulse 38, temperature 98.3 F (36.8 C), temperature source Oral, resp. rate 26, weight 65.772 kg (145 lb), SpO2 95 %.  PHYSICAL EXAMINATION:   Physical Exam  GENERAL:  82 y.o.-year-old patient lying in the bed with no acute distress. On 6L o2 but able to complete sentences and does not appear to be in distress EYES: Pupils equal, round, reactive to light and accommodation. No scleral icterus. Extraocular muscles intact.  HEENT: Head atraumatic, normocephalic. Oropharynx and nasopharynx clear.  NECK:  Supple, no jugular venous distention. No thyroid enlargement, no tenderness.  LUNGS: Diffuse bilateral crackles with occasional wheeze noted, no rhonchi.. No use of accessory muscles of respiration.  CARDIOVASCULAR: S1, S2 normal. No rubs, or gallops.  3/6 systolic murmur heard ABDOMEN: Soft, nontender, nondistended. Bowel sounds present. No organomegaly or mass.  EXTREMITIES: No pedal edema, cyanosis, or clubbing.  NEUROLOGIC: Cranial nerves II through XII are intact. Muscle strength 5/5 in all extremities. Sensation intact. Gait not checked.  PSYCHIATRIC: The patient is alert and oriented x 3.  SKIN: No obvious rash, lesion, or ulcer.   LABORATORY PANEL:   CBC  Recent Labs Lab 08/08/15 1052  WBC 6.7  HGB 13.2  HCT 40.8  PLT 194   ------------------------------------------------------------------------------------------------------------------  Chemistries   Recent Labs Lab 08/08/15 1052  NA 144  K 4.1  CL 103  CO2 30  GLUCOSE 103*  BUN 15  CREATININE 1.09*  CALCIUM 9.0   ------------------------------------------------------------------------------------------------------------------  Cardiac Enzymes  Recent Labs Lab 08/08/15 1052  TROPONINI 0.06*   ------------------------------------------------------------------------------------------------------------------  RADIOLOGY:  Dg Chest 1 View  08/08/2015  CLINICAL DATA:  Increase shortness of breath for 1 week. Hypoxia. Pulmonary fibrosis. EXAM: CHEST 1 VIEW COMPARISON:  06/25/2014 FINDINGS: Cardiomegaly  with vascular congestion. Diffuse interstitial prominence throughout the lungs, stable since prior study compatible with chronic lung disease. No effusions or acute bony abnormality. IMPRESSION: Cardiomegaly, vascular congestion. Interstitial disease/ pulmonary fibrosis. No definite acute process. Electronically Signed   By: Rolm Baptise M.D.   On: 08/08/2015 11:22    EKG:   Orders placed or performed during the hospital encounter of 08/08/15  . ED EKG  . ED EKG  . EKG 12-Lead  . EKG 12-Lead    IMPRESSION AND PLAN:   [name]  is a 82 y.o. female with a known history of hypertension, arthritis, end-stage pulmonary fibrosis on 4 L chronic home oxygen  presents to the hospital secondary to difficulty breathing and generalized weakness for 2 days now.  #1 acute on chronic hypoxic respiratory failure-worsening of her pulmonary fibrosis, no inflammation or infection identified. -Admit to floor, continue 6 L oxygen at this time and been asked tolerated to 4 L -Consult for pulmonology team -Started on prednisone -Physical therapy consult. -Care management consult for home health and home oxygen.  #2 hypertension-continue home medications.  #3 history of palpitations-follows with [facility] every 6 months. No arrhythmias at this time. Continue Cardizem.  #4 hypothyroidism-continue Synthroid  #5 DVT prophylaxis-on Lovenox   All the records are reviewed and case discussed with ED provider. Management plans discussed with the patient, family and they are in agreement.  CODE STATUS: Full Code  TOTAL TIME TAKING CARE OF THIS PATIENT: 50 minutes.    Gladstone Lighter M.D on 08/08/2015 at 3:09 PM  Between 7am to 6pm - Pager - [phone]  After 6pm go to [email or web address] - password [email or web address] [facility]  [facility]  Office  [phone]  CC: Primary care physician; Adin Hector, MD

## 2015-08-08 NOTE — ED Notes (Signed)
Pt from home with son who reports pt has been having increased sob x 1 week, O2 concentrator at home had stopped working 24 hrs ago and pt has been using portable O2 x 2 4 hrs. O2 sat initally 74% on 4L [place].

## 2015-08-08 NOTE — ED Notes (Signed)
Patient transported to X-ray 

## 2015-08-08 NOTE — Progress Notes (Signed)

## 2015-08-08 NOTE — ED Provider Notes (Signed)
[facility] Emergency Department Provider Note  ____________________________________________  Time seen: Seen upon arrival to the emergency department treatment area  I have reviewed the triage vital signs and the nursing notes.   HISTORY  Chief Complaint Shortness of Breath    HPI [name] is a 82 y.o. female with a history of pulmonary fibrosis on 4 L of nasal cannula oxygen who is presenting today with several weeks of worsening shortness of breath and hypoxia. She denies any pain at this time. Says that over the past several weeks every time she exerts herself she becomes progressively short of breath. She does have a chronic cough which is unchanged. She sees pulmonology. [facility], Dr.Kasa.  Denies fever. Says that last night her floor concentrator broke and she has been using her portable. Not on any steroids at this time. Triage vital signs taken on nasal cannula 4 L.   Past Medical History  Diagnosis Date  . Chronic cough 02/2011    CXR with increased interstitial markings [facility] pulmonology  . Psoriasis     elbows  . Hypertension   . Arthritis   . Pelvis fracture ([place]) 2012  . History of dizziness   . Valvular heart disease   . Cervical disc disease   . Hx of appendectomy   . Hyperlipidemia   . Cancer [facility])     skin    Patient Active Problem List   Diagnosis Date Noted  . Mediastinal lymphadenopathy 07/15/2014  . Postinflammatory pulmonary fibrosis ([place]) 08/26/2012  . Cough 08/26/2012  . Irregular heart beat 08/26/2012  . Shortness of breath 04/05/2012  . Valvular heart disease 04/05/2012    Past Surgical History  Procedure Laterality Date  . Back surgery  2007  . Total abdominal hysterectomy w/ bilateral salpingoophorectomy    . Skin cancer excision    . Cardiac catheterization  2006    Current Outpatient Rx  Name  Route  Sig  Dispense  Refill  . alendronate (FOSAMAX) 70 MG tablet   Oral   Take 70 mg by mouth once a  week.         . bimatoprost (LUMIGAN) 0.03 % ophthalmic solution   Both Eyes   Place 1 drop into both eyes at bedtime.         . COMBIGAN 0.2-0.5 % ophthalmic solution   Both Eyes   Place 1 drop into both eyes 2 (two) times daily.      5   . diltiazem (DILACOR XR) 180 MG 24 hr capsule   Oral   Take 180 mg by mouth daily.         . furosemide (LASIX) 40 MG tablet   Oral   Take 40 mg by mouth every other day.         . levothyroxine (SYNTHROID, LEVOTHROID) 50 MCG tablet   Oral   Take 50 mcg by mouth daily before breakfast.         . NON FORMULARY      Oxygen 2-4 liter as needed.         Marland Kitchen olmesartan (BENICAR) 40 MG tablet   Oral   Take 40 mg by mouth daily.         . potassium chloride (MICRO-K) 10 MEQ CR capsule   Oral   Take 10 mEq by mouth every other day.      11     Allergies Lipitor and Simvastatin  No family history on file.  Social History  Social History  Substance Use Topics  . Smoking status: Never Smoker   . Smokeless tobacco: Never Used  . Alcohol Use: 0.0 oz/week    0 Standard drinks or equivalent per week     Comment: occasional    Review of Systems Constitutional: No fever/chills Eyes: No visual changes. ENT: No sore throat. Cardiovascular: Denies chest pain. Respiratory: As above Gastrointestinal: No abdominal pain.  No nausea, no vomiting.  No diarrhea.  No constipation. Genitourinary: Negative for dysuria. Musculoskeletal: Negative for back pain. Skin: Negative for rash. Neurological: Negative for headaches, focal weakness or numbness.  10-point ROS otherwise negative.  ____________________________________________   PHYSICAL EXAM:  VITAL SIGNS: ED Triage Vitals  Enc Vitals Group     BP 08/08/15 1049 153/78 mmHg     Pulse Rate 08/08/15 1049 78     Resp 08/08/15 1049 26     Temp 08/08/15 1049 98.3 F (36.8 C)     Temp Source 08/08/15 1049 Oral     SpO2 08/08/15 1049 74 %     Weight 08/08/15 1049 145 lb  (65.772 kg)     Height --      Head Cir --      Peak Flow --      Pain Score --      Pain Loc --      Pain Edu? --      Excl. in [place]? --     Constitutional: Alert and oriented. Well appearing and in no acute distress. Eyes: Conjunctivae are normal. PERRL. EOMI. Head: Atraumatic. Nose: No congestion/rhinnorhea. Mouth/Throat: Mucous membranes are moist.  Oropharynx non-erythematous. Neck: No stridor.   Cardiovascular: Normal rate, regular rhythm. Grossly normal heart sounds.  Good peripheral circulation. Respiratory: Normal respiratory effort.  No retractions. Rales to the bilateral lower and mid fields. Gastrointestinal: Soft and nontender. No distention. No abdominal bruits. No CVA tenderness. Musculoskeletal: No lower extremity tenderness nor edema.  No joint effusions. Neurologic:  Normal speech and language. No gross focal neurologic deficits are appreciated. No gait instability. Skin:  Skin is warm, dry and intact. No rash noted. Psychiatric: Mood and affect are normal. Speech and behavior are normal.  ____________________________________________   LABS (all labs ordered are listed, but only abnormal results are displayed)  Labs Reviewed  CBC WITH DIFFERENTIAL/PLATELET - Abnormal; Notable for the following:    RDW 15.9 (*)    Lymphs Abs 0.9 (*)    All other components within normal limits  BASIC METABOLIC PANEL - Abnormal; Notable for the following:    Glucose, Bld 103 (*)    Creatinine, Ser 1.09 (*)    GFR calc non Af Amer 44 (*)    GFR calc Af Amer 51 (*)    All other components within normal limits  TROPONIN I - Abnormal; Notable for the following:    Troponin I 0.06 (*)    All other components within normal limits  BRAIN NATRIURETIC PEPTIDE - Abnormal; Notable for the following:    B Natriuretic Peptide 998.0 (*)    All other components within normal limits   ____________________________________________  EKG  ED ECG REPORT I, Doran Stabler, the  attending physician, personally viewed and interpreted this ECG.   Date: 08/08/2015  EKG Time: 12 PM  Rate: 73  Rhythm: normal sinus rhythm  Axis: Normal axis  Intervals:none  ST&T Change: No ST segment elevation or depression. No abnormal T-wave inversions.  ____________________________________________  RADIOLOGY  Mild congestion with cardiomegaly. ____________________________________________   PROCEDURES  ____________________________________________   INITIAL IMPRESSION / ASSESSMENT AND PLAN / ED COURSE  Pertinent labs & imaging results that were available during my care of the patient were reviewed by me and considered in my medical decision making (see chart for details).  Patient now 91-92% on 6 L nasal cannula. Says feels subjectively improved.  ----------------------------------------- 11:45 AM on 08/08/2015 ----------------------------------------- Discussed case Dr. Raul Del who does not recommend steroids at this time. Recommend staying at 6 L nasal cannula oxygen.  ----------------------------------------- 12:31 PM on 08/08/2015 -----------------------------------------  Patient is tolerating the nasal cannula well and is continuously saturating in the low 90s. No further respiratory distress. Does have an elevated BNP as well as pulmonary congestion on the chest x-ray. Possibly with CHF component to the symptoms. Patient is supposed be taking Lasix but only does so inconsistently. We'll give Lasix and aspirin. Also mild troponin bump. Discussed the plan as well as the lab and imaging findings with the patient and her family. They understand the plan for mission and are willing to comply. Signed out to Dr. Tressia Miners.  ____________________________________________   FINAL CLINICAL IMPRESSION(S) / ED DIAGNOSES  Hypoxia. CHF. Pulmonary fibrosis.    Orbie Pyo, MD 08/08/15 [phone]

## 2015-08-09 ENCOUNTER — Ambulatory Visit: Payer: Medicare HMO

## 2015-08-09 DIAGNOSIS — R0902 Hypoxemia: Secondary | ICD-10-CM

## 2015-08-09 DIAGNOSIS — J9611 Chronic respiratory failure with hypoxia: Secondary | ICD-10-CM

## 2015-08-09 DIAGNOSIS — J8410 Pulmonary fibrosis, unspecified: Principal | ICD-10-CM

## 2015-08-09 LAB — BASIC METABOLIC PANEL
ANION GAP: 9 (ref 5–15)
BUN: 19 mg/dL (ref 6–20)
CALCIUM: 8.5 mg/dL — AB (ref 8.9–10.3)
CO2: 32 mmol/L (ref 22–32)
CREATININE: 0.99 mg/dL (ref 0.44–1.00)
Chloride: 102 mmol/L (ref 101–111)
GFR calc Af Amer: 57 mL/min — ABNORMAL LOW (ref >60)
GFR, EST NON AFRICAN AMERICAN: 49 mL/min — AB (ref >60)
GLUCOSE: 101 mg/dL — AB (ref 65–99)
Potassium: 3.8 mmol/L (ref 3.5–5.1)
Sodium: 143 mmol/L (ref 135–145)

## 2015-08-09 LAB — CBC
HEMATOCRIT: 41.3 % (ref 35.0–47.0)
HEMOGLOBIN: 13.6 g/dL (ref 12.0–16.0)
MCH: 30.6 pg (ref 26.0–34.0)
MCHC: 32.9 g/dL (ref 32.0–36.0)
MCV: 93.1 fL (ref 80.0–100.0)
Platelets: 188 10*3/uL (ref 150–440)
RBC: 4.44 MIL/uL (ref 3.80–5.20)
RDW: 15.6 % — ABNORMAL HIGH (ref 11.5–14.5)
WBC: 8.7 10*3/uL (ref 3.6–11.0)

## 2015-08-09 MED ORDER — ENOXAPARIN SODIUM 60 MG/0.6ML ~~LOC~~ SOLN
60.0000 mg | Freq: Two times a day (BID) | SUBCUTANEOUS | Status: DC
  Administered 2015-08-09 – 2015-08-10 (×3): 60 mg via SUBCUTANEOUS
  Filled 2015-08-09 (×3): qty 0.6

## 2015-08-09 MED ORDER — MOMETASONE FURO-FORMOTEROL FUM 100-5 MCG/ACT IN AERO
2.0000 | INHALATION_SPRAY | Freq: Two times a day (BID) | RESPIRATORY_TRACT | Status: DC
  Administered 2015-08-09 – 2015-08-11 (×5): 2 via RESPIRATORY_TRACT
  Filled 2015-08-09: qty 8.8

## 2015-08-09 MED ORDER — ALPRAZOLAM 0.5 MG PO TABS
0.5000 mg | ORAL_TABLET | Freq: Once | ORAL | Status: AC
  Administered 2015-08-09: 0.5 mg via ORAL
  Filled 2015-08-09: qty 1

## 2015-08-09 MED ORDER — SODIUM CHLORIDE 0.9 % IV BOLUS (SEPSIS)
500.0000 mL | Freq: Once | INTRAVENOUS | Status: AC
  Administered 2015-08-09: 500 mL via INTRAVENOUS

## 2015-08-09 MED ORDER — DIGOXIN 0.25 MG/ML IJ SOLN
0.5000 mg | Freq: Once | INTRAMUSCULAR | Status: AC
  Administered 2015-08-09: 0.5 mg via INTRAVENOUS
  Filled 2015-08-09: qty 2

## 2015-08-09 NOTE — Progress Notes (Signed)
PT Attempt Note  Patient Details Name: [name] MRN: [id number] DOB: [birth year]   Cancelled Treatment:    Reason Eval/Treat Not Completed: Medical issues which prohibited therapy. Chart reviewed and RN consulted. RN reports that currently pt with low BP and elevated HR. EKG is being ordered. Not appropriate for PT at this time. Will attempt PT evaluation at later time/date as pt is appropriate.  Lyndel Safe Shelene Krage PT, DPT   Artis Beggs 08/09/2015, 10:11 AM

## 2015-08-09 NOTE — Consult Note (Signed)
Reason for Consult: atrial fibrillation rapid ventricular response Referring Physician:  Dr. Lupita Shutter is an 82 y.o. female.  HPI:  Multiple medical problems including pulmonary fibrosis shortness of breath hypertension hypoxemia valvular heart disease who was admitted with hypoxemia but developed atrial fibrillation rapid ventricular response. She describes palpitation often on denies any previous atrial fibrillation is not on anticoagulation. Denies any chest pain still is on oxygen but mainly complains of feeling weak tired fatigued. Denies sputum production no fever chills or sweats no recent falls no blackout spells of syncope  Past Medical History  Diagnosis Date  . Chronic cough 02/2011    CXR with increased interstitial markings [facility] pulmonology  . Psoriasis     elbows  . Hypertension   . Arthritis   . Pelvis fracture ([place]) 2012  . History of dizziness   . Valvular heart disease   . Cervical disc disease   . Hx of appendectomy   . Hyperlipidemia   . Cancer ([place])     skin  . Pulmonary fibrosis ([place])     on 4L home oxygen    Past Surgical History  Procedure Laterality Date  . Back surgery  2007  . Total abdominal hysterectomy w/ bilateral salpingoophorectomy    . Skin cancer excision    . Cardiac catheterization  2006  . Cataract surgery      Family History  Problem Relation Age of Onset  . CAD Father   . CAD Mother     Social History:  reports that she has never smoked. She has never used smokeless tobacco. She reports that she drinks alcohol. She reports that she does not use illicit drugs.  Allergies:  Allergies  Allergen Reactions  . Lipitor [Atorvastatin] Nausea And Vomiting  . Simvastatin Nausea And Vomiting    Medications: I have reviewed the patient's current medications.  Results for orders placed or performed during the hospital encounter of 08/08/15 (from the past 48 hour(s))  CBC with Differential     Status: Abnormal   Collection Time:  08/08/15 10:52 AM  Result Value Ref Range   WBC 6.7 3.6 - 11.0 K/uL   RBC 4.36 3.80 - 5.20 MIL/uL   Hemoglobin 13.2 12.0 - 16.0 g/dL   HCT 40.8 35.0 - 47.0 %   MCV 93.6 80.0 - 100.0 fL   MCH 30.3 26.0 - 34.0 pg   MCHC 32.3 32.0 - 36.0 g/dL   RDW 15.9 (H) 11.5 - 14.5 %   Platelets 194 150 - 440 K/uL   Neutrophils Relative % 72 %   Neutro Abs 4.9 1.4 - 6.5 K/uL   Lymphocytes Relative 14 %   Lymphs Abs 0.9 (L) 1.0 - 3.6 K/uL   Monocytes Relative 8 %   Monocytes Absolute 0.5 0.2 - 0.9 K/uL   Eosinophils Relative 5 %   Eosinophils Absolute 0.3 0 - 0.7 K/uL   Basophils Relative 1 %   Basophils Absolute 0.1 0 - 0.1 K/uL  Basic metabolic panel     Status: Abnormal   Collection Time: 08/08/15 10:52 AM  Result Value Ref Range   Sodium 144 135 - 145 mmol/L   Potassium 4.1 3.5 - 5.1 mmol/L   Chloride 103 101 - 111 mmol/L   CO2 30 22 - 32 mmol/L   Glucose, Bld 103 (H) 65 - 99 mg/dL   BUN 15 6 - 20 mg/dL   Creatinine, Ser 1.09 (H) 0.44 - 1.00 mg/dL   Calcium 9.0 8.9 -  10.3 mg/dL   GFR calc non Af Amer 44 (L) >60 mL/min   GFR calc Af Amer 51 (L) >60 mL/min    Comment: (NOTE) The eGFR has been calculated using the CKD EPI equation. This calculation has not been validated in all clinical situations. eGFR's persistently <60 mL/min signify possible Chronic Kidney Disease.    Anion gap 11 5 - 15  Troponin I     Status: Abnormal   Collection Time: 08/08/15 10:52 AM  Result Value Ref Range   Troponin I 0.06 (H) <0.031 ng/mL    Comment: READ BACK AND VERIFIED WITH SHANNON HATCH ON 08/08/15 AT 1141 BY TLB        PERSISTENTLY INCREASED TROPONIN VALUES IN THE RANGE OF 0.04-0.49 ng/mL CAN BE SEEN IN:       -UNSTABLE ANGINA       -CONGESTIVE HEART FAILURE       -MYOCARDITIS       -CHEST TRAUMA       -ARRYHTHMIAS       -LATE PRESENTING MYOCARDIAL INFARCTION       -COPD   CLINICAL FOLLOW-UP RECOMMENDED.   Brain natriuretic peptide     Status: Abnormal   Collection Time: 08/08/15 10:52  AM  Result Value Ref Range   B Natriuretic Peptide 998.0 (H) 0.0 - 100.0 pg/mL  Troponin I     Status: Abnormal   Collection Time: 08/08/15  5:14 PM  Result Value Ref Range   Troponin I 0.07 (H) <0.031 ng/mL    Comment: READ BACK AND VERIFIED WITH JOSH SIMSER @ 6294 08/08/15 BY T[facility]        PERSISTENTLY INCREASED TROPONIN VALUES IN THE RANGE OF 0.04-0.49 ng/mL CAN BE SEEN IN:       -UNSTABLE ANGINA       -CONGESTIVE HEART FAILURE       -MYOCARDITIS       -CHEST TRAUMA       -ARRYHTHMIAS       -LATE PRESENTING MYOCARDIAL INFARCTION       -COPD   CLINICAL FOLLOW-UP RECOMMENDED.   Troponin I     Status: Abnormal   Collection Time: 08/08/15 11:18 PM  Result Value Ref Range   Troponin I 0.07 (H) <0.031 ng/mL    Comment: PREVIOUS RESULT CALLED AT 1750 08/08/15.PMH        PERSISTENTLY INCREASED TROPONIN VALUES IN THE RANGE OF 0.04-0.49 ng/mL CAN BE SEEN IN:       -UNSTABLE ANGINA       -CONGESTIVE HEART FAILURE       -MYOCARDITIS       -CHEST TRAUMA       -ARRYHTHMIAS       -LATE PRESENTING MYOCARDIAL INFARCTION       -COPD   CLINICAL FOLLOW-UP RECOMMENDED.   Basic metabolic panel     Status: Abnormal   Collection Time: 08/09/15  5:03 AM  Result Value Ref Range   Sodium 143 135 - 145 mmol/L   Potassium 3.8 3.5 - 5.1 mmol/L   Chloride 102 101 - 111 mmol/L   CO2 32 22 - 32 mmol/L   Glucose, Bld 101 (H) 65 - 99 mg/dL   BUN 19 6 - 20 mg/dL   Creatinine, Ser 0.99 0.44 - 1.00 mg/dL   Calcium 8.5 (L) 8.9 - 10.3 mg/dL   GFR calc non Af Amer 49 (L) >60 mL/min   GFR calc Af Amer 57 (L) >60 mL/min    Comment: (NOTE) The eGFR has  been calculated using the CKD EPI equation. This calculation has not been validated in all clinical situations. eGFR's persistently <60 mL/min signify possible Chronic Kidney Disease.    Anion gap 9 5 - 15  CBC     Status: Abnormal   Collection Time: 08/09/15  5:03 AM  Result Value Ref Range   WBC 8.7 3.6 - 11.0 K/uL   RBC 4.44 3.80 - 5.20 MIL/uL    Hemoglobin 13.6 12.0 - 16.0 g/dL   HCT 41.3 35.0 - 47.0 %   MCV 93.1 80.0 - 100.0 fL   MCH 30.6 26.0 - 34.0 pg   MCHC 32.9 32.0 - 36.0 g/dL   RDW 15.6 (H) 11.5 - 14.5 %   Platelets 188 150 - 440 K/uL    Dg Chest 1 View  08/08/2015  CLINICAL DATA:  Increase shortness of breath for 1 week. Hypoxia. Pulmonary fibrosis. EXAM: CHEST 1 VIEW COMPARISON:  06/25/2014 FINDINGS: Cardiomegaly with vascular congestion. Diffuse interstitial prominence throughout the lungs, stable since prior study compatible with chronic lung disease. No effusions or acute bony abnormality. IMPRESSION: Cardiomegaly, vascular congestion. Interstitial disease/ pulmonary fibrosis. No definite acute process. Electronically Signed   By: Rolm Baptise M.D.   On: 08/08/2015 11:22    Review of Systems  Constitutional: Positive for malaise/fatigue.  HENT: Positive for congestion.   Eyes: Negative.   Respiratory: Positive for cough, shortness of breath and wheezing.   Cardiovascular: Positive for palpitations, orthopnea and leg swelling.  Gastrointestinal: Negative.   Genitourinary: Negative.   Musculoskeletal: Negative.   Skin: Negative.   Neurological: Positive for weakness.  Endo/Heme/Allergies: Negative.   Psychiatric/Behavioral: Negative.    Blood pressure 114/85, pulse 133, temperature 97.5 F (36.4 C), temperature source Oral, resp. rate 16, height 4' 10"  (1.473 m), weight 62.596 kg (138 lb), SpO2 93 %. Physical Exam  Nursing note and vitals reviewed. Constitutional: She is oriented to person, place, and time. She appears well-developed and well-nourished.  HENT:  Head: Normocephalic and atraumatic.  Eyes: Conjunctivae and EOM are normal. Pupils are equal, round, and reactive to light.  Neck: Normal range of motion. Neck supple.  Cardiovascular: S1 normal, S2 normal and normal pulses.  An irregularly irregular rhythm present. Tachycardia present.   Murmur heard.  Systolic murmur is present with a grade of 2/6   Respiratory: Effort normal. Tachypnea noted. She has decreased breath sounds.  GI: Soft. Bowel sounds are normal.  Musculoskeletal: Normal range of motion.  Neurological: She is alert and oriented to person, place, and time. She has normal reflexes.  Skin: Skin is warm and dry.  Psychiatric: She has a normal mood and affect.    Assessment/Plan: Atrial fibrillation paroxysmal  shortness of breath  hypoxemia  hypertension  COPD  pulmonary fibrosis  palpitations  tachycardia  valvular heart disease  thyroid disease  borderline troponin . PLAN  continue Cardizem for rate control increase the dose to 240 once a day  continue telemetry  Tachycardia  supplemental oxygen for hypoxemia  inhalers and for shortness of breath COPD  continue prednisone for pulmonary fibrosis  hypertension control with hydralazine diltiazem  continue short-term anticoagulation with Lovenox  consider adding low-dose digoxin if remains tachycardic  [name] D. 08/09/2015, 12:34 PM

## 2015-08-09 NOTE — Care Management Important Message (Signed)
Important Message  Patient Details  Name: CANIYAH MISCHEL MRN: [id number] Date of Birth: January 22, 1924   Medicare Important Message Given:  Yes    Dason Mosley A, RN 08/09/2015, 8:10 AM

## 2015-08-09 NOTE — Progress Notes (Signed)
Dr Blythe Stanford notified increased hr, orders given.

## 2015-08-09 NOTE — Care Management Note (Addendum)
Case Management Note  Patient Details  Name: [name] MRN: [id number] Date of Birth: 01/23/1924  Subjective/Objective:    82yo [name] was admitted 08/08/15 with shortness of breath from pulmonary fibrosis. New on-set A-Fib with RVR. Cardiology consult today. Chronic 4L N/C at home supplied by [employer]. PCP= Dr Ramonita Lab. Pharmacy=[facility] in [place]. [name] is widowed and lives alone. She reports that her son [name] [phone], and his wife, are very supportive and provide her with transportation. She currently has no home health services. Uses a rolling walker at home and cannot recall having any other home assistive equipment. She reports that Dr Mortimer Fries from Pulmonology, she, and her son [name] have a meeting at [employer]. Anticipate discharge home with home health. Case management will follow for discharge planning. [name] has used [facility] in the past and would like to use them again but requested no female therapists, she wants female home health staff only. Cheryl at [facility] is aware.                Action/Plan:   Expected Discharge Date:                  Expected Discharge Plan:     In-House Referral:     Discharge planning Services     Post Acute Care Choice:    Choice offered to:     DME Arranged:    DME Agency:     HH Arranged:    [place] Agency:     Status of Service:     Medicare Important Message Given:  Yes Date Medicare IM Given:    Medicare IM give by:    Date Additional Medicare IM Given:    Additional Medicare Important Message give by:     If discussed at [place] of Stay Meetings, dates discussed:    Additional Comments:  Erasto Sleight A, RN 08/09/2015, 12:01 PM

## 2015-08-09 NOTE — Progress Notes (Signed)
Spoke to Dr. Bridgett Larsson, scheduled MRI for today was postponed due to patient's condition.

## 2015-08-09 NOTE — Progress Notes (Addendum)
[place] at [place] NAME: [name]    MR#:  [id number]  DATE OF BIRTH:  [birth year]  SUBJECTIVE:  CHIEF COMPLAINT:   Chief Complaint  Patient presents with  . Shortness of Breath   Shortness of breath, on oxygen [place] 5 L. hypotension and A. Fib with RVR at 130-150 this morning REVIEW OF SYSTEMS:  CONSTITUTIONAL: No fever, has generalized weakness.  EYES: No blurred or double vision.  EARS, NOSE, AND THROAT: No tinnitus or ear pain.  RESPIRATORY: No cough, but has shortness of breath, no wheezing or hemoptysis.  CARDIOVASCULAR: No chest pain, orthopnea, edema.  GASTROINTESTINAL: No nausea, vomiting, diarrhea or abdominal pain.  GENITOURINARY: No dysuria, hematuria.  ENDOCRINE: No polyuria, nocturia,  HEMATOLOGY: No anemia, easy bruising or bleeding SKIN: No rash or lesion. MUSCULOSKELETAL: No joint pain or arthritis.   NEUROLOGIC: No tingling, numbness, weakness.  PSYCHIATRY: No anxiety or depression.   DRUG ALLERGIES:   Allergies  Allergen Reactions  . Lipitor [Atorvastatin] Nausea And Vomiting  . Simvastatin Nausea And Vomiting    VITALS:  Blood pressure 143/85, pulse 85, temperature 98.1 F (36.7 C), temperature source Oral, resp. rate 17, height 4\' 10"  (1.473 m), weight 62.596 kg (138 lb), SpO2 93 %.  PHYSICAL EXAMINATION:  GENERAL:  82 y.o.-year-old patient lying in the bed with no acute distress.  EYES: Pupils equal, round, reactive to light and accommodation. No scleral icterus. Extraocular muscles intact.  HEENT: Head atraumatic, normocephalic. Oropharynx and nasopharynx clear.  NECK:  Supple, no jugular venous distention. No thyroid enlargement, no tenderness.  LUNGS: Normal breath sounds bilaterally, no wheezing, but has bilateral crackles . No use of accessory muscles of respiration.  CARDIOVASCULAR: S1, S2 normal. No murmurs, rubs, or gallops.  ABDOMEN: Soft, nontender, nondistended. Bowel sounds present. No  organomegaly or mass.  EXTREMITIES: No pedal edema, cyanosis, or clubbing.  NEUROLOGIC: Cranial nerves II through XII are intact. Muscle strength 5/5 in all extremities. Sensation intact. Gait not checked.  PSYCHIATRIC: The patient is alert and oriented x 3.  SKIN: No obvious rash, lesion, or ulcer.    LABORATORY PANEL:   CBC  Recent Labs Lab 08/09/15 0503  WBC 8.7  HGB 13.6  HCT 41.3  PLT 188   ------------------------------------------------------------------------------------------------------------------  Chemistries   Recent Labs Lab 08/09/15 0503  NA 143  K 3.8  CL 102  CO2 32  GLUCOSE 101*  BUN 19  CREATININE 0.99  CALCIUM 8.5*   ------------------------------------------------------------------------------------------------------------------  Cardiac Enzymes  Recent Labs Lab 08/08/15 2318  TROPONINI 0.07*   ------------------------------------------------------------------------------------------------------------------  RADIOLOGY:  Dg Chest 1 View  08/08/2015  CLINICAL DATA:  Increase shortness of breath for 1 week. Hypoxia. Pulmonary fibrosis. EXAM: CHEST 1 VIEW COMPARISON:  06/25/2014 FINDINGS: Cardiomegaly with vascular congestion. Diffuse interstitial prominence throughout the lungs, stable since prior study compatible with chronic lung disease. No effusions or acute bony abnormality. IMPRESSION: Cardiomegaly, vascular congestion. Interstitial disease/ pulmonary fibrosis. No definite acute process. Electronically Signed   By: Rolm Baptise M.D.   On: 08/08/2015 11:22    EKG:   Orders placed or performed during the hospital encounter of 08/08/15  . ED EKG  . EKG 12-Lead  . EKG 12-Lead  . EKG 12-Lead  . EKG 12-Lead    ASSESSMENT AND PLAN:   #1 acute on chronic hypoxic respiratory failure-worsening of her pulmonary fibrosis, no inflammation or infection identified. Try to wean down to 4 L oxygen. Follow-up pulmonology consult. Continue  prednisone  and nebulizer treatment. -Physical therapy consult. -Care management consult for home health and home oxygen.  #2 hypertension-continue cardizem but discontinue Lasix due to hypotension.  #3. New-onset Paroxysmal A. Fib with RVR. Heart rate was about 130-150. She was treated with digoxin 1 dose IV and cardizem po. Lovenox was given. Increase Cardizem dose to 240 mg by mouth, continue short-term anticoagulation with Lovenox, consider adding low-dose digoxin if remains tachycardic per Dr. Clayborn Bigness.   #4 hypothyroidism-continue Synthroid  * Hypotension. BP was 88/64. The patient was treated with normal saline bolus 500 ml. Hypotension improved.   All the records are reviewed and case discussed with Care Management/Social Workerr. Management plans discussed with the patient, family and they are in agreement.  CODE STATUS: Full code  TOTAL TIME TAKING CARE OF THIS PATIENT: 46 minutes.  Greater than 50% time was spent on coordination of care and face-to-face counseling.  POSSIBLE D/C IN 2-3 DAYS, DEPENDING ON CLINICAL CONDITION.   Demetrios Loll M.D on 08/09/2015 at 3:24 PM  Between 7am to 6pm - Pager - [phone]  After 6pm go to [email or web address] - password [email or web address] [facility]  [facility]  Office  [phone]  CC: Primary care physician; Adin Hector, MD

## 2015-08-09 NOTE — Care Management Note (Signed)
Case Management Note  Patient Details  Name: [name] MRN: [id number] Date of Birth: [birth year]  Subjective/Objective:     Just received Digoxin IV push. Case management will revisit [name] later.               Action/Plan:   Expected Discharge Date:                  Expected Discharge Plan:     In-House Referral:     Discharge planning Services     Post Acute Care Choice:    Choice offered to:     DME Arranged:    DME Agency:     HH Arranged:    [place] Agency:     Status of Service:     Medicare Important Message Given:  Yes Date Medicare IM Given:    Medicare IM give by:    Date Additional Medicare IM Given:    Additional Medicare Important Message give by:     If discussed at [place] of Stay Meetings, dates discussed:    Additional Comments:  Willeen Novak A, RN 08/09/2015, 10:18 AM

## 2015-08-09 NOTE — Progress Notes (Signed)
Initial Nutrition Assessment     INTERVENTION:  Meals and snacks: cater to pt preferences Medical Nutrition Supplement Therapy: Will add mightyshake BID for added nutrition   NUTRITION DIAGNOSIS:   Inadequate oral intake related to acute illness as evidenced by per patient/family report.    GOAL:   Patient will meet greater than or equal to 90% of their needs    MONITOR:    (Energy intake, Pulmonary profile)  REASON FOR ASSESSMENT:   Malnutrition Screening Tool    ASSESSMENT:      Pt admitted with shortness of breath, acute on chronic respiratory failure, worsening pulmonary fibrosis  Past Medical History  Diagnosis Date  . Chronic cough 02/2011    CXR with increased interstitial markings [name] pulmonology  . Psoriasis     elbows  . Hypertension   . Arthritis   . Pelvis fracture ([place]) 2012  . History of dizziness   . Valvular heart disease   . Cervical disc disease   . Hx of appendectomy   . Hyperlipidemia   . Cancer ([place])     skin  . Pulmonary fibrosis ([facility])     on 4L home oxygen    Current Nutrition: ate eggs and fruit this am  Food/Nutrition-Related History: reports decreased intake for the past 2 weeks (feeling weak)   Scheduled Medications:  . brimonidine  1 drop Both Eyes BID   And  . timolol  1 drop Both Eyes BID  . diltiazem  180 mg Oral Daily  . docusate sodium  100 mg Oral BID  . enoxaparin (LOVENOX) injection  30 mg Subcutaneous Q24H  . ipratropium-albuterol  3 mL Nebulization Q6H  . latanoprost  1 drop Both Eyes QHS  . levothyroxine  50 mcg Oral QAC breakfast  . [START ON 08/10/2015] potassium chloride  10 mEq Oral QODAY  . predniSONE  50 mg Oral Q breakfast  . sodium chloride flush  3 mL Intravenous Q12H     Electrolyte/Renal Profile and Glucose Profile:   Recent Labs Lab 08/08/15 1052 08/09/15 0503  NA 144 143  K 4.1 3.8  CL 103 102  CO2 30 32  BUN 15 19  CREATININE 1.09* 0.99  CALCIUM 9.0 8.5*  GLUCOSE 103* 101*    Gastrointestinal Profile: Last BM:WDL   Nutrition-Focused Physical Exam Findings: Nutrition-Focused physical exam completed. Findings are no fat depletion, no muscle depletion, and no edema.      Weight Change: 5% wt loss in the last 4 months, not significant    Diet Order:  Diet Heart Room service appropriate?: Yes; Fluid consistency:: Thin  Skin:   reviewed  Height:   Ht Readings from Last 1 Encounters:  08/08/15 4\' 10"  (1.473 m)    Weight:   Wt Readings from Last 1 Encounters:  08/08/15 138 lb (62.596 kg)    Ideal Body Weight:     BMI:  Body mass index is 28.85 kg/(m^2).  Estimated Nutritional Needs:   Kcal:  BEE 934 kcals (IF 1.0-1.2, AF 1.3) [id number] kcals/d.   Protein:  (1.0-1.2 g/kg) 62-74 g/d  Fluid:  (25-48ml/kg)1550-1860ml/d  EDUCATION NEEDS:   No education needs identified at this time  [place]. Zenia Resides, [place], [place] (pager) Weekend/On-Call pager [phone])

## 2015-08-09 NOTE — Progress Notes (Signed)
At shift change this AM pt's HR was jumping between 120-180s. Notified Dr. Bridgett Larsson. MD ordered 12 lead EKG. EKG showed afib with AVR and manual BP was 82/50. Spoke with MD, new order for digoxin IV and a 5105ml bolus. BP post bolus was 114/85 though HR was 133 sustaining. MD stated to give Cardizem daily med which was held that AM due to low BP. Post Cardizem HR was 123 and BP stable. Planned to transfer patient to 2A - telemetry floor. However, prior to transfer patient switched to sinus rhythm, HR 85. Will continue to monitor patient.

## 2015-08-09 NOTE — Consult Note (Signed)
[facility] Consultation     Date: 08/09/2015,   MRN# [id number] [name] July 14, 1923 Code Status:     Code Status Orders        Start     Ordered   08/08/15 1529  Full code   Continuous     08/08/15 1528    Code Status History    Date Active Date Inactive Code Status Order ID Comments User Context   This patient has a current code status but no historical code status.     Hosp day:@LENGTHOFSTAYDAYS @ Referring MD: @ATDPROV @     PCP:      AdmissionWeight: 145 lb (65.772 kg)                 CurrentWeight: 138 lb (62.596 kg) [name] is a 82 y.o. old female seen in consultation for fibrosis at the request of Dr Bridgett Larsson     CHIEF COMPLAINT:   resp distress   HISTORY OF PRESENT ILLNESS   82 y.o. female with a known history of hypertension, arthritis, end-stage pulmonary fibrosis on 4 L chronic home oxygen - presents to the hospital secondary to difficulty breathing and generalized weakness for 2 days  -Patient denies any fevers or chills. She has oxygen concentrator at home but feels like it hasn't been giving her enough oxygen and feels like it's broken. For the last few days she's been  -associated with wheezing, some productive cough - Denies any nausea vomiting or diarrhea. No exposure to sick contacts or no recent travel.  Patient resting comfortably now on 5 L [place] Patient subsequently had afib with RVR-awaiting cardiology consult  Ct chest /CXR reviewed/l ILD c/w IPF  MEDICATIONS    Home Medication:  No current outpatient prescriptions on file.  Current Medication:   Current facility-administered medications:  .  acetaminophen (TYLENOL) tablet 650 mg, 650 mg, Oral, Q6H PRN **OR** acetaminophen (TYLENOL) suppository 650 mg, 650 mg, Rectal, Q6H PRN, Gladstone Lighter, MD .  brimonidine (ALPHAGAN) 0.2 % ophthalmic solution 1 drop, 1 drop, Both Eyes, BID, 1 drop at 08/08/15 2147 **AND** timolol (TIMOPTIC) 0.5 % ophthalmic solution 1  drop, 1 drop, Both Eyes, BID, Gladstone Lighter, MD, 1 drop at 08/08/15 2147 .  diltiazem (CARDIZEM CD) 24 hr capsule 180 mg, 180 mg, Oral, Daily, Gladstone Lighter, MD, 180 mg at 08/09/15 1129 .  docusate sodium (COLACE) capsule 100 mg, 100 mg, Oral, BID, Gladstone Lighter, MD, 100 mg at 08/09/15 1128 .  enoxaparin (LOVENOX) injection 30 mg, 30 mg, Subcutaneous, Q24H, Gladstone Lighter, MD, 30 mg at 08/08/15 2134 .  hydrALAZINE (APRESOLINE) injection 10 mg, 10 mg, Intravenous, Q6H PRN, Gladstone Lighter, MD, 10 mg at 08/08/15 2215 .  ipratropium-albuterol (DUONEB) 0.5-2.5 (3) MG/3ML nebulizer solution 3 mL, 3 mL, Nebulization, Q6H, Gladstone Lighter, MD, 3 mL at 08/09/15 0842 .  latanoprost (XALATAN) 0.005 % ophthalmic solution 1 drop, 1 drop, Both Eyes, QHS, Gladstone Lighter, MD, 1 drop at 08/08/15 2147 .  levothyroxine (SYNTHROID, LEVOTHROID) tablet 50 mcg, 50 mcg, Oral, QAC breakfast, Gladstone Lighter, MD, 50 mcg at 08/09/15 0857 .  [START ON 08/10/2015] potassium chloride (K-DUR,KLOR-CON) CR tablet 10 mEq, 10 mEq, Oral, QODAY, Gladstone Lighter, MD .  predniSONE (DELTASONE) tablet 50 mg, 50 mg, Oral, Q breakfast, Gladstone Lighter, MD, 50 mg at 08/09/15 0856 .  sodium chloride flush (NS) 0.9 % injection 3 mL, 3 mL, Intravenous, Q12H, Gladstone Lighter, MD, 3 mL at 08/09/15 1000     ALLERGIES  Lipitor and Simvastatin     REVIEW OF SYSTEMS   Review of Systems  Constitutional: Positive for malaise/fatigue. Negative for fever, chills, weight loss and diaphoresis.  HENT: Negative for congestion and hearing loss.   Respiratory: Positive for cough, sputum production, shortness of breath and wheezing. Negative for hemoptysis.   Cardiovascular: Negative for chest pain, palpitations, orthopnea and leg swelling.  Gastrointestinal: Negative for heartburn, nausea, vomiting and abdominal pain.  Genitourinary: Negative for dysuria and urgency.  Musculoskeletal: Negative for myalgias.  Skin:  Negative for rash.  Neurological: Negative for dizziness, tingling, tremors, weakness and headaches.  Endo/Heme/Allergies: Does not bruise/bleed easily.  Psychiatric/Behavioral: Negative for depression, suicidal ideas and substance abuse. The patient is nervous/anxious.   All other systems reviewed and are negative.    VS: BP 114/85 mmHg  Pulse 133  Temp(Src) 97.5 F (36.4 C) (Oral)  Resp 16  Ht 4\' 10"  (1.473 m)  Wt 138 lb (62.596 kg)  BMI 28.85 kg/m2  SpO2 93%     PHYSICAL EXAM   Physical Exam  Constitutional: She is oriented to person, place, and time. She appears well-developed and well-nourished. No distress.  HENT:  Head: Normocephalic and atraumatic.  Mouth/Throat: No oropharyngeal exudate.  Eyes: EOM are normal. Pupils are equal, round, and reactive to light. No scleral icterus.  Neck: Normal range of motion. Neck supple.  Cardiovascular: Normal rate, regular rhythm and normal heart sounds.   No murmur heard. Pulmonary/Chest: No stridor. No respiratory distress. She has wheezes. She has rales.  Abdominal: Soft. Bowel sounds are normal.  Musculoskeletal: Normal range of motion. She exhibits no edema.  Neurological: She is alert and oriented to person, place, and time. She displays normal reflexes. Coordination normal.  Skin: Skin is warm. She is not diaphoretic.  Psychiatric: She has a normal mood and affect.   Baseline resp status: chronic SOB and DOE     LABS    Recent Labs     08/08/15  1052  08/09/15  0503  HGB  13.2  13.6  HCT  40.8  41.3  MCV  93.6  93.1  WBC  6.7  8.7  BUN  15  19  CREATININE  1.09*  0.99  GLUCOSE  103*  101*  CALCIUM  9.0  8.5*  ,     ASSESSMENT/PLAN   82 yo white female with acute resp distress with chronic hypoxic resp  Failure from end stage pulm fibrosis wit acute afib with RVR  1.oxygen as needed 2.continue oral prednisone to assist with any acute interstitial process 3.start dulera 4.neb therapy as  prescribed 5.follow up cardiology recs (lasix,cardizem, BP meds)  Overall ,prognosis is poor. Recommend Palliative care consult to discuss goals of care. I will attempt to meet with son and patient tomorrow if available  I have personally obtained a history, examined the patient, evaluated laboratory and independently reviewed imaging results, formulated the assessment and plan and placed orders.  The Patient requires high complexity decision making for assessment and support, frequent evaluation and titration of therapies, application of advanced monitoring technologies and extensive interpretation of multiple databases.   Patient satisfied with Plan of action and management. All questions answered   Corrin Parker, M.D.  [facility]  Medical Director [place] Director [facility] Cardio-Pulmonary Department

## 2015-08-09 NOTE — Progress Notes (Signed)
Dr Marcille Blanco notified of increased  Heart rate 150's- 160's. Orders given

## 2015-08-09 NOTE — Progress Notes (Signed)
PT Refusal Note  Patient Details Name: [name] MRN: [id number] DOB: [birth year]   Cancelled Treatment:    Reason Eval/Treat Not Completed: Patient declined, no reason specified. Chart reviewed and RN consulted. HR and BP are now controlled and pt is appropriate to attempt PT evaluation. Attempted PT evaluation with patient however pt refuses therapy currently. Pt is very anxious regarding her elevated HR this morning and does not feel well enough to participate with therapy. Pt agrees to work with therapy tomorrow. Spend extended time explaining role of PT in the hospital and importance of participation for general wellness as well as discharge planning.  Lyndel Safe Devony Mcgrady PT, DPT   Julis Haubner 08/09/2015, 2:27 PM

## 2015-08-09 NOTE — Progress Notes (Signed)
ANTICOAGULATION CONSULT NOTE - Initial Consult  Pharmacy Consult for Lovenox Indication: atrial fibrillation  Allergies  Allergen Reactions  . Lipitor [Atorvastatin] Nausea And Vomiting  . Simvastatin Nausea And Vomiting    Patient Measurements: Height: 4\' 10"  (147.3 cm) Weight: 138 lb (62.596 kg) IBW/kg (Calculated) : 40.9 Heparin Dosing Weight: na  Vital Signs: Temp: 97.5 F (36.4 C) (01/30 0906) Temp Source: Oral (01/30 0906) BP: 114/85 mmHg (01/30 1115) Pulse Rate: 133 (01/30 1115)  Labs:  Recent Labs  08/08/15 1052 08/08/15 1714 08/08/15 2318 08/09/15 0503  HGB 13.2  --   --  13.6  HCT 40.8  --   --  41.3  PLT 194  --   --  188  CREATININE 1.09*  --   --  0.99  TROPONINI 0.06* 0.07* 0.07*  --     Estimated Creatinine Clearance: 30.2 mL/min (by C-G formula based on Cr of 0.99).   Medical History: Past Medical History  Diagnosis Date  . Chronic cough 02/2011    CXR with increased interstitial markings [facility] pulmonology  . Psoriasis     elbows  . Hypertension   . Arthritis   . Pelvis fracture ([place]) 2012  . History of dizziness   . Valvular heart disease   . Cervical disc disease   . Hx of appendectomy   . Hyperlipidemia   . Cancer ([employer])     skin  . Pulmonary fibrosis (HCC)     on 4L home oxygen    Medications:  Scheduled:  . brimonidine  1 drop Both Eyes BID   And  . timolol  1 drop Both Eyes BID  . diltiazem  180 mg Oral Daily  . docusate sodium  100 mg Oral BID  . enoxaparin (LOVENOX) injection  60 mg Subcutaneous Q12H  . ipratropium-albuterol  3 mL Nebulization Q6H  . latanoprost  1 drop Both Eyes QHS  . levothyroxine  50 mcg Oral QAC breakfast  . mometasone-formoterol  2 puff Inhalation BID  . [START ON 08/10/2015] potassium chloride  10 mEq Oral QODAY  . predniSONE  50 mg Oral Q breakfast  . sodium chloride flush  3 mL Intravenous Q12H    Assessment: Patient is a 82yo female admitted for hypoxia. Now with a-fib. Pharmacy consulted to  dose Lovenox for a-fib. Patient has been on Lovenox 30mg  SQ q24h for DVT prevention.  1/30 SCr=0.99, est.CrCl=30.2 ml/min, weight=62.6 kg  Goal of Therapy:  Monitor platelets by anticoagulation protocol: Yes   Plan:  Will transition patient to Lovenox 60mg  SQ q12h for treatment dosing for a-fib. Patient is right at the borderline for renal adjustment but SCr has improved so will watch renal function closely. Will check a SCr with AM labs.  Paulina Fusi, PharmD, BCPS 08/09/2015 12:02 PM

## 2015-08-10 LAB — CREATININE, SERUM
CREATININE: 0.95 mg/dL (ref 0.44–1.00)
GFR calc Af Amer: 60 mL/min — ABNORMAL LOW (ref >60)
GFR calc non Af Amer: 52 mL/min — ABNORMAL LOW (ref >60)

## 2015-08-10 MED ORDER — IPRATROPIUM-ALBUTEROL 0.5-2.5 (3) MG/3ML IN SOLN
3.0000 mL | Freq: Three times a day (TID) | RESPIRATORY_TRACT | Status: DC
  Administered 2015-08-11: 3 mL via RESPIRATORY_TRACT
  Filled 2015-08-10 (×2): qty 3

## 2015-08-10 MED ORDER — APIXABAN 5 MG PO TABS
5.0000 mg | ORAL_TABLET | Freq: Two times a day (BID) | ORAL | Status: DC
  Administered 2015-08-10 – 2015-08-11 (×2): 5 mg via ORAL
  Filled 2015-08-10 (×2): qty 1

## 2015-08-10 NOTE — Care Management (Signed)
Patient is agreeable to home health nurse but does not agree to have physical therapy.  Notified [facility] of referral for nursing and informed agency would see patient within 24 hours of discharge.  Patient to discharge home on Eliquis.  Provided patient with 30 day coupon.  [name] is emphatic about being contacted regarding schedule for home visits.  He wishes to be present for the visit.

## 2015-08-10 NOTE — Progress Notes (Signed)
ANTICOAGULATION CONSULT NOTE - Initial Consult  Pharmacy Consult for Apixaban Indication: atrial fibrillation  Allergies  Allergen Reactions  . Lipitor [Atorvastatin] Nausea And Vomiting  . Simvastatin Nausea And Vomiting    Patient Measurements: Height: 4\' 10"  (147.3 cm) Weight: 138 lb (62.596 kg) IBW/kg (Calculated) : 40.9 Heparin Dosing Weight: na  Vital Signs: Temp: 98.3 F (36.8 C) (01/31 1405) Temp Source: Oral (01/31 1405) BP: 140/65 mmHg (01/31 1405) Pulse Rate: 62 (01/31 1405)  Labs:  Recent Labs  08/08/15 1052 08/08/15 1714 08/08/15 2318 08/09/15 0503 08/10/15 0818  HGB 13.2  --   --  13.6  --   HCT 40.8  --   --  41.3  --   PLT 194  --   --  188  --   CREATININE 1.09*  --   --  0.99 0.95  TROPONINI 0.06* 0.07* 0.07*  --   --     Estimated Creatinine Clearance: 31.4 mL/min (by C-G formula based on Cr of 0.95).   Medical History: Past Medical History  Diagnosis Date  . Chronic cough 02/2011    CXR with increased interstitial markings [facility] pulmonology  . Psoriasis     elbows  . Hypertension   . Arthritis   . Pelvis fracture ([place]) 2012  . History of dizziness   . Valvular heart disease   . Cervical disc disease   . Hx of appendectomy   . Hyperlipidemia   . Cancer ([place])     skin  . Pulmonary fibrosis (HCC)     on 4L home oxygen    Medications:  Scheduled:  . apixaban  5 mg Oral BID  . brimonidine  1 drop Both Eyes BID   And  . timolol  1 drop Both Eyes BID  . diltiazem  180 mg Oral Daily  . docusate sodium  100 mg Oral BID  . ipratropium-albuterol  3 mL Nebulization Q6H  . latanoprost  1 drop Both Eyes QHS  . levothyroxine  50 mcg Oral QAC breakfast  . mometasone-formoterol  2 puff Inhalation BID  . potassium chloride  10 mEq Oral QODAY  . predniSONE  50 mg Oral Q breakfast  . sodium chloride flush  3 mL Intravenous Q12H    Assessment: Patient with new onset a-fib. Initially started on Lovenox 1mg /kg (60mg ) SQ q12h, last dose given  at 12:33 1/31. Pharmacy consulted to convert to apixaban.  SCr=0.95, age=89, weight=62.6kg  Goal of Therapy:  Monitor platelets by anticoagulation protocol: Yes   Plan:  Will discontinue Lovenox and transition patient to Apixaban 5mg  PO bid starting at 22:00 (~10hrs after last Lovenox dose).  Paulina Fusi, PharmD, BCPS 08/10/2015 2:42 PM

## 2015-08-10 NOTE — Plan of Care (Signed)
Problem: Safety: Goal: Ability to remain free from injury will improve Outcome: Not Progressing Pt is refusing bed alarm, high risk for falling.

## 2015-08-10 NOTE — Evaluation (Signed)
Physical Therapy Evaluation Patient Details Name: [name] MRN: [id number] DOB: 03/26/1924 Today's Date: 08/10/2015   History of Present Illness  [name] is a 82 y.o. female with a known history of hypertension, arthritis, end-stage pulmonary fibrosis on 4 L chronic home oxygen presents to the hospital secondary to difficulty breathing and generalized weakness for 2 days now. Patient denies any fevers or chills. She has oxygen concentrator at home but feels like it hasn't been giving her enough oxygen and feels like it's broken. For the last few days she's been using her portable oxygen and has been feeling extremely short of breath, worsened wheezing. Her cough is at baseline. Nonproductive and dry cough. Denies any nausea vomiting or diarrhea. No exposure to sick contacts or no recent travel. She follows up with [facility] pulmonology has been taken off of anti-fibrotic therapy. Now she is on 6 L oxygen and she was noted to be hypoxic on presentation. Chest x-ray with no acute findings. Pt denies falls in the last 12 months  Clinical Impression  Pt demonstrates considerable instability with ambulation and poor safety awareness. Pt appears to be somewhat in denial regarding condition of balance and need for therapy services. She considerably improves her stability with use of rolling walker and pt encouraged to utilize her rollator at all times initial after discharge. Recommend HH PT as well as fall alert system however pt refuses. Pt lives alone but son is nearby. Pt with considerable desaturation during ambulation on 6 L/min O2 (see gait section). Very poor cardiopulmonary endurance currently. Pt will benefit from skilled PT services to address deficits in strength, balance, and mobility in order to return to full function at home.     Follow Up Recommendations Home health PT;Supervision/Assistance - 24 hour (Pt refuses HH PT. 24/7 assist initially. High fall risk)    Equipment Recommendations  Other (comment) (Fall alert system, pt refuses. Must use rollator at all time)    Recommendations for Other Services       Precautions / Restrictions Precautions Precautions: Fall Precaution Comments: O2 Restrictions Weight Bearing Restrictions: No      Mobility  Bed Mobility Overal bed mobility: Independent             General bed mobility comments: Good speed and sequencing noted with transfers  Transfers Overall transfer level: Needs assistance Equipment used: Rolling walker (2 wheeled) Transfers: Sit to/from Stand Sit to Stand: Min guard         General transfer comment: Pt demonstrates good speed and sequencing with transfer  Ambulation/Gait Ambulation/Gait assistance: Min assist Ambulation Distance (Feet): 150 Feet Assistive device: Rolling walker (2 wheeled) Gait Pattern/deviations: Step-through pattern     General Gait Details: Pt demonstrates good gait speed and step length. However she is unsteady without use of assistive device and has 3 almost falls requiring therapist to assist to prevent her from falling. Pt transitioned to rolling walker and demonstrates considerable improvement in stability. She still has a few small stumbles but easily self corrects with UE support from rolling walker. Vitals monitored and SaO2 drops to 71% on 6 L/min. Pt requires approximately 2-3 minutes of seated rest with pursed lip breathing for SaO2 to recover to >90%. Pt must use rollator at discharge at home and when out in community for safety. She is a very high fall risk  Stairs            Wheelchair Mobility    Modified Rankin (Stroke Patients Only)  Balance Overall balance assessment: Needs assistance Sitting-balance support: No upper extremity supported Sitting balance-Leahy Scale: Good     Standing balance support: No upper extremity supported Standing balance-Leahy Scale: Fair Standing balance comment: Positive [place] after approximately 8-10  seconds with eyes closed. Single leg balance approximately 2 seconds bilaterally. Pt with intermittent stumbling during ambulation without UE assist.                              Pertinent Vitals/Pain Pain Assessment: No/denies pain    Home Living Family/patient expects to be discharged to:: Private residence Living Arrangements: Alone Available Help at Discharge: Family Type of Home: House Home Access: Stairs to enter Entrance Stairs-Rails: Right Entrance Stairs-Number of Steps: 3 Home Layout: Able to live on main level with bedroom/bathroom;Laundry or work area in [place]: Clinical cytogeneticist - 4 wheels;Cane - single point      Prior Function Level of Independence: Independent with assistive device(s)         Comments: Pt reports full community ambulation with use of rollator as needed. Does not use assistive device at home     Hand Dominance   Dominant Hand: Right    Extremity/Trunk Assessment   Upper Extremity Assessment: Overall WFL for tasks assessed           Lower Extremity Assessment: Overall WFL for tasks assessed         Communication   Communication: No difficulties  Cognition Arousal/Alertness: Awake/alert Behavior During Therapy: WFL for tasks assessed/performed Overall Cognitive Status: Within Functional Limits for tasks assessed                      General Comments      Exercises        Assessment/Plan    PT Assessment Patient needs continued PT services  PT Diagnosis Difficulty walking;Abnormality of gait;Generalized weakness   PT Problem List Decreased activity tolerance;Decreased balance;Decreased mobility;Decreased safety awareness;Cardiopulmonary status limiting activity  PT Treatment Interventions DME instruction;Gait training;Therapeutic activities;Therapeutic exercise;Balance training;Neuromuscular re-education;Patient/family education   PT Goals (Current goals can be found in the Care Plan  section) Acute Rehab PT Goals Patient Stated Goal: "Return home today." PT Goal Formulation: With patient/family Time For Goal Achievement: 08/24/15 Potential to Achieve Goals: Good    Frequency Min 2X/week   Barriers to discharge Decreased caregiver support Pt lives alone. Sons lives nearby and agrees to provide additional support after discharge    Co-evaluation               End of Session Equipment Utilized During Treatment: Gait belt Activity Tolerance: Patient tolerated treatment well Patient left: in bed;with call bell/phone within reach;with bed alarm set;with family/visitor present (sitting at EOB) Nurse Communication: Mobility status (SaO2 during ambulation. Pt upright at EOB)         Time: [id number] PT Time Calculation (min) (ACUTE ONLY): 34 min   Charges:   PT Evaluation $PT Eval Moderate Complexity: 1 Procedure PT Treatments $Gait Training: 8-22 mins   PT G Codes:       Lyndel Safe Mela Perham PT, DPT   Stefanie Hodgens 08/10/2015, 12:11 PM

## 2015-08-10 NOTE — Progress Notes (Addendum)
[place] [place] NAME: [name]    MR#:  [id number]  DATE OF BIRTH:  1923/07/22  SUBJECTIVE:  CHIEF COMPLAINT:   Chief Complaint  Patient presents with  . Shortness of Breath   Better shortness of breath, on oxygen [place] 5 L. SAT down to 70's on exertion.  REVIEW OF SYSTEMS:  CONSTITUTIONAL: No fever, or weakness.  EYES: No blurred or double vision.  EARS, NOSE, AND THROAT: No tinnitus or ear pain.  RESPIRATORY: No cough, but has shortness of breath, no wheezing or hemoptysis.  CARDIOVASCULAR: No chest pain, orthopnea, edema.  GASTROINTESTINAL: No nausea, vomiting, diarrhea or abdominal pain.  GENITOURINARY: No dysuria, hematuria.  ENDOCRINE: No polyuria, nocturia,  HEMATOLOGY: No anemia, easy bruising or bleeding SKIN: No rash or lesion. MUSCULOSKELETAL: No joint pain or arthritis.   NEUROLOGIC: No tingling, numbness, weakness.  PSYCHIATRY: No anxiety or depression.   DRUG ALLERGIES:   Allergies  Allergen Reactions  . Lipitor [Atorvastatin] Nausea And Vomiting  . Simvastatin Nausea And Vomiting    VITALS:  Blood pressure 140/65, pulse 62, temperature 98.3 F (36.8 C), temperature source Oral, resp. rate 14, height 4\' 10"  (1.473 m), weight 62.596 kg (138 lb), SpO2 92 %.  PHYSICAL EXAMINATION:  GENERAL:  82 y.o.-year-old patient lying in the bed with no acute distress.  EYES: Pupils equal, round, reactive to light and accommodation. No scleral icterus. Extraocular muscles intact.  HEENT: Head atraumatic, normocephalic. Oropharynx and nasopharynx clear.  NECK:  Supple, no jugular venous distention. No thyroid enlargement, no tenderness.  LUNGS: Normal breath sounds bilaterally, no wheezing, but has bilateral crackles . No use of accessory muscles of respiration.  CARDIOVASCULAR: S1, S2 normal. No murmurs, rubs, or gallops.  ABDOMEN: Soft, nontender, nondistended. Bowel sounds present. No organomegaly or mass.   EXTREMITIES: No pedal edema, cyanosis, or clubbing.  NEUROLOGIC: Cranial nerves II through XII are intact. Muscle strength 5/5 in all extremities. Sensation intact. Gait not checked.  PSYCHIATRIC: The patient is alert and oriented x 3.  SKIN: No obvious rash, lesion, or ulcer.    LABORATORY PANEL:   CBC  Recent Labs Lab 08/09/15 0503  WBC 8.7  HGB 13.6  HCT 41.3  PLT 188   ------------------------------------------------------------------------------------------------------------------  Chemistries   Recent Labs Lab 08/09/15 0503 08/10/15 0818  NA 143  --   K 3.8  --   CL 102  --   CO2 32  --   GLUCOSE 101*  --   BUN 19  --   CREATININE 0.99 0.95  CALCIUM 8.5*  --    ------------------------------------------------------------------------------------------------------------------  Cardiac Enzymes  Recent Labs Lab 08/08/15 2318  TROPONINI 0.07*   ------------------------------------------------------------------------------------------------------------------  RADIOLOGY:  No results found.  EKG:   Orders placed or performed during the hospital encounter of 08/08/15  . ED EKG  . EKG 12-Lead  . EKG 12-Lead  . EKG 12-Lead  . EKG 12-Lead    ASSESSMENT AND PLAN:   #1 acute on chronic hypoxic respiratory failure-worsening of her pulmonary fibrosis, no inflammation or infection identified. Try to wean down to 4 L oxygen. Starteddulera [name]. Continue prednisone and nebulizer treatment. -Physical therapy consult. -Care management consult for home health and home oxygen.  #2 hypertension-continue cardizem but discontinue Lasix due to hypotension.  #3. New-onset Paroxysmal A. Fib with RVR. Heart rate was about 130-150. She was treated with digoxin 1 dose IV and cardizem po. Lovenox was given. Increased Cardizem dose to 240 mg  by mouth, continue short-term anticoagulation with Lovenox, consider adding low-dose digoxin if remains tachycardic per Dr.  Clayborn Ware.   #4 hypothyroidism-continue Synthroid  * Hypotension. BP was 88/64. The patient was treated with normal saline bolus 500 ml. Hypotension improved.  Palliative care consult due to poor prognosis per [name].  All the records are reviewed and case discussed with Care Management/Social Workerr. Management plans discussed with the patient, her son and they are in agreement.  CODE STATUS: Full code  TOTAL TIME TAKING CARE OF THIS PATIENT: 39 minutes.  Greater than 50% time was spent on coordination of care and face-to-face counseling.  POSSIBLE D/C IN 2 DAYS, DEPENDING ON CLINICAL CONDITION.   [name] M.D on 08/10/2015 at 4:25 PM  Between 7am to 6pm - Pager - [phone]  After 6pm go to [email or web address] - password [email or web address] [facility]  [facility]  Office  [phone]  CC: Primary care physician; Adrienne Hector, MD

## 2015-08-10 NOTE — Progress Notes (Signed)
Pt has jumped out of bed several times stating, "I don't need an alarm because I am not crippled." Pt does not want the alarm on. Education done on why it is important to have the bed alarm on due to her being a high fall risk.

## 2015-08-10 NOTE — Progress Notes (Signed)
ANTICOAGULATION CONSULT NOTE - Initial Consult  Pharmacy Consult for Lovenox Indication: atrial fibrillation  Allergies  Allergen Reactions  . Lipitor [Atorvastatin] Nausea And Vomiting  . Simvastatin Nausea And Vomiting    Patient Measurements: Height: 4\' 10"  (147.3 cm) Weight: 138 lb (62.596 kg) IBW/kg (Calculated) : 40.9 Heparin Dosing Weight: na  Vital Signs: Temp: 97.4 F (36.3 C) (01/31 0609) Temp Source: Oral (01/31 0609) BP: 119/39 mmHg (01/31 0609) Pulse Rate: 76 (01/31 0609)  Labs:  Recent Labs  08/08/15 1052 08/08/15 1714 08/08/15 2318 08/09/15 0503 08/10/15 0818  HGB 13.2  --   --  13.6  --   HCT 40.8  --   --  41.3  --   PLT 194  --   --  188  --   CREATININE 1.09*  --   --  0.99 0.95  TROPONINI 0.06* 0.07* 0.07*  --   --     Estimated Creatinine Clearance: 31.4 mL/min (by C-G formula based on Cr of 0.95).   Medical History: Past Medical History  Diagnosis Date  . Chronic cough 02/2011    CXR with increased interstitial markings [facility] pulmonology  . Psoriasis     elbows  . Hypertension   . Arthritis   . Pelvis fracture ([place]) 2012  . History of dizziness   . Valvular heart disease   . Cervical disc disease   . Hx of appendectomy   . Hyperlipidemia   . Cancer ([place])     skin  . Pulmonary fibrosis (HCC)     on 4L home oxygen    Medications:  Scheduled:  . brimonidine  1 drop Both Eyes BID   And  . timolol  1 drop Both Eyes BID  . diltiazem  180 mg Oral Daily  . docusate sodium  100 mg Oral BID  . enoxaparin (LOVENOX) injection  60 mg Subcutaneous Q12H  . ipratropium-albuterol  3 mL Nebulization Q6H  . latanoprost  1 drop Both Eyes QHS  . levothyroxine  50 mcg Oral QAC breakfast  . mometasone-formoterol  2 puff Inhalation BID  . potassium chloride  10 mEq Oral QODAY  . predniSONE  50 mg Oral Q breakfast  . sodium chloride flush  3 mL Intravenous Q12H    Assessment: Patient is a 82yo female admitted for hypoxia. Now with a-fib.  Pharmacy consulted to dose Lovenox for a-fib. Patient has been on Lovenox 30mg  SQ q24h for DVT prevention.  1/31 SCr=0.95, est.CrCl=31.4 ml/min, weight=62.6 kg  Goal of Therapy:  Monitor platelets by anticoagulation protocol: Yes   Plan:  Will transition patient to Lovenox 60mg  SQ q12h for treatment dosing for a-fib. Patient is right at the borderline for renal adjustment but SCr has improved so will watch renal function closely. Will check a SCr with AM labs.  Paulina Fusi, PharmD, BCPS 08/10/2015 12:04 PM

## 2015-08-11 ENCOUNTER — Telehealth: Payer: Self-pay

## 2015-08-11 MED ORDER — APIXABAN 5 MG PO TABS: 5.0000 mg | ORAL_TABLET | Freq: Two times a day (BID) | ORAL | Status: DC

## 2015-08-11 MED ORDER — DILTIAZEM HCL ER COATED BEADS 180 MG PO CP24: 180.0000 mg | ORAL_CAPSULE | Freq: Every day | ORAL | Status: DC

## 2015-08-11 MED ORDER — PREDNISONE 10 MG PO TABS: ORAL_TABLET | ORAL | Status: DC

## 2015-08-11 MED ORDER — PREDNISONE 20 MG PO TABS
40.0000 mg | ORAL_TABLET | Freq: Every day | ORAL | Status: DC
  Administered 2015-08-11: 40 mg via ORAL
  Filled 2015-08-11: qty 2

## 2015-08-11 NOTE — Progress Notes (Signed)
Pt stable. IV removed. D/c instructions given and education provided. Signed prescriptions verified and given. Pt states she understands instructions. Pt dressed and escorted out by staff. Driven home by family.  

## 2015-08-11 NOTE — Care Management (Signed)
Called and faxed discharge information to [facility].  Patient is in agreement

## 2015-08-11 NOTE — Discharge Instructions (Signed)
Heart healthy diet. Activity as tolerated. Continue home O2 [place]. Home health.

## 2015-08-11 NOTE — Care Management Important Message (Signed)
Important Message  Patient Details  Name: SHAILYNN BERNINGER MRN: [id number] Date of Birth: [birth year]   Medicare Important Message Given:  Yes    Juliann Pulse A Mateen Franssen 08/11/2015, 9:32 AM

## 2015-08-11 NOTE — Discharge Summary (Signed)
[place] [facility] [place] NAME: [name]    MR#:  [id number]  DATE OF BIRTH:  [birth year]  DATE OF ADMISSION:  08/08/2015 ADMITTING PHYSICIAN: Gladstone Lighter, MD  DATE OF DISCHARGE: 08/11/2015  2:06 PM  PRIMARY CARE PHYSICIAN: Tama High III, MD    ADMISSION DIAGNOSIS:  Pulmonary fibrosis (HCC) [J84.10] Hypoxia [R09.02] Acute congestive heart failure, unspecified congestive heart failure type ([place]) [I50.9]   DISCHARGE DIAGNOSIS:  acute on chronic hypoxic respiratory failure-worsening of her pulmonary fibrosis New-onset Paroxysmal A. Fib with RVR SECONDARY DIAGNOSIS:   Past Medical History  Diagnosis Date  . Chronic cough 02/2011    CXR with increased interstitial markings [facility] pulmonology  . Psoriasis     elbows  . Hypertension   . Arthritis   . Pelvis fracture ([place]) 2012  . History of dizziness   . Valvular heart disease   . Cervical disc disease   . Hx of appendectomy   . Hyperlipidemia   . Cancer ([place])     skin  . Pulmonary fibrosis ([place])     on 4L home oxygen    HOSPITAL COURSE:   #1 acute on chronic hypoxic respiratory failure-worsening of her pulmonary fibrosis, no inflammation or infection identified. Tried to wean down to 4 L oxygen. Started dulera Dr. Mortimer Fries. taper prednisone and nebulizer treatment. Need  home health and home oxygen.  #2 hypertension-continue cardizem but discontinued Lasix due to hypotension. BP is normal, resume lasix after discharge.  #3. New-onset Paroxysmal A. Fib with RVR. Heart rate was about 130-150. She was treated with digoxin 1 dose IV and cardizem po. Lovenox was given. Increased Cardizem dose to 240 mg by mouth, but decreased back due to low BP, continue short-term anticoagulation, consider adding low-dose digoxin if remains tachycardic per Dr. Clayborn Bigness. Explained to pt and his son about anticoagulation option, benefit and side effects. They agree starting anticoagulation. Started  eliquis yesterday.  #4 hypothyroidism-continue Synthroid  * Hypotension. BP was 88/64 2 days ago. The patient was treated with normal saline bolus 500 ml. Hypotension improved. BP is normal today.  DISCHARGE CONDITIONS:   Stable, discharged to home with [facility] today.  CONSULTS OBTAINED:  Treatment Team:  Flora Lipps, MD Yolonda Kida, MD  DRUG ALLERGIES:   Allergies  Allergen Reactions  . Lipitor [Atorvastatin] Nausea And Vomiting  . Simvastatin Nausea And Vomiting    DISCHARGE MEDICATIONS:   Discharge Medication List as of 08/11/2015 11:11 AM    START taking these medications   Details  apixaban (ELIQUIS) 5 MG TABS tablet Take 1 tablet (5 mg total) by mouth 2 (two) times daily., Starting 08/11/2015, Until Discontinued, Print    predniSONE (DELTASONE) 10 MG tablet 40 mg po daily for 2 days, 20 mg po daily for 2 days, 10 mg po daily for 2 days., Print      CONTINUE these medications which have NOT CHANGED   Details  alendronate (FOSAMAX) 70 MG tablet Take 70 mg by mouth once a week. Take on Friday., Starting 07/07/2014, Until Discontinued, Historical Med    aspirin EC 81 MG tablet Take 81 mg by mouth daily., Until Discontinued, Historical Med    azelastine (ASTELIN) 0.1 % nasal spray Place 1 spray into both nostrils 2 (two) times daily as needed. For congestion., Starting 05/12/2015, Until Discontinued, Historical Med    bimatoprost (LUMIGAN) 0.03 % ophthalmic solution Place 1 drop into both eyes 2 (two) times daily. , Until Discontinued, Historical  Med    clobetasol cream (TEMOVATE) [id number] % Apply 1 application topically 2 (two) times daily as needed. For psoriasis., Starting 05/12/2015, Until Discontinued, Historical Med    COMBIGAN 0.2-0.5 % ophthalmic solution Place 1 drop into both eyes 2 (two) times daily., Starting 06/22/2014, Until Discontinued, Historical Med    diltiazem (CARDIZEM CD) 180 MG 24 hr capsule Take 180 mg by mouth daily., Starting 06/27/2015, Until  Discontinued, Historical Med    furosemide (LASIX) 40 MG tablet Take 20 mg by mouth every other day. , Until Discontinued, Historical Med    levothyroxine (SYNTHROID, LEVOTHROID) 50 MCG tablet Take 50 mcg by mouth daily before breakfast., Until Discontinued, Historical Med    olmesartan (BENICAR) 40 MG tablet Take 40 mg by mouth daily., Until Discontinued, Historical Med    OXYGEN Inhale 4 L into the lungs continuous., Until Discontinued, Historical Med    potassium chloride (MICRO-K) 10 MEQ CR capsule Take 10 mEq by mouth every other day., Starting 06/26/2014, Until Discontinued, Historical Med      STOP taking these medications     diltiazem (DILACOR XR) 180 MG 24 hr capsule          DISCHARGE INSTRUCTIONS:   If you experience worsening of your admission symptoms, develop shortness of breath, life threatening emergency, suicidal or homicidal thoughts you must seek medical attention immediately by calling 911 or calling your MD immediately  if symptoms less severe.  You Must read complete instructions/literature along with all the possible adverse reactions/side effects for all the Medicines you take and that have been prescribed to you. Take any new Medicines after you have completely understood and accept all the possible adverse reactions/side effects.   Please note  You were cared for by a hospitalist during your hospital stay. If you have any questions about your discharge medications or the care you received while you were in the hospital after you are discharged, you can call the unit and asked to speak with the hospitalist on call if the hospitalist that took care of you is not available. Once you are discharged, your primary care physician will handle any further medical issues. Please note that NO REFILLS for any discharge medications will be authorized once you are discharged, as it is imperative that you return to your primary care physician (or establish a relationship with  a primary care physician if you do not have one) for your aftercare needs so that they can reassess your need for medications and monitor your lab values.    Today   SUBJECTIVE   No complaint.   VITAL SIGNS:  Blood pressure 113/49, pulse 82, temperature 97.8 F (36.6 C), temperature source Oral, resp. rate 20, height 4\' 10"  (1.473 m), weight 62.596 kg (138 lb), SpO2 94 %.  I/O:   Intake/Output Summary (Last 24 hours) at 08/11/15 1625 Last data filed at 08/11/15 1300  Gross per 24 hour  Intake      0 ml  Output    700 ml  Net   -700 ml    PHYSICAL EXAMINATION:  GENERAL:  82 y.o.-year-old patient lying in the bed with no acute distress.  EYES: Pupils equal, round, reactive to light and accommodation. No scleral icterus. Extraocular muscles intact.  HEENT: Head atraumatic, normocephalic. Oropharynx and nasopharynx clear.  NECK:  Supple, no jugular venous distention. No thyroid enlargement, no tenderness.  LUNGS: Normal breath sounds bilaterally, no wheezing, but has bilateral crackles. No use of accessory muscles of respiration.  CARDIOVASCULAR: S1,  S2 normal. No murmurs, rubs, or gallops.  ABDOMEN: Soft, non-tender, non-distended. Bowel sounds present. No organomegaly or mass.  EXTREMITIES: No pedal edema, cyanosis, or clubbing.  NEUROLOGIC: Cranial nerves II through XII are intact. Muscle strength 5/5 in all extremities. Sensation intact. Gait not checked.  PSYCHIATRIC: The patient is alert and oriented x 3.  SKIN: No obvious rash, lesion, or ulcer.   DATA REVIEW:   CBC  Recent Labs Lab 08/09/15 0503  WBC 8.7  HGB 13.6  HCT 41.3  PLT 188    Chemistries   Recent Labs Lab 08/09/15 0503 08/10/15 0818  NA 143  --   K 3.8  --   CL 102  --   CO2 32  --   GLUCOSE 101*  --   BUN 19  --   CREATININE 0.99 0.95  CALCIUM 8.5*  --     Cardiac Enzymes  Recent Labs Lab 08/08/15 2318  TROPONINI 0.07*    Microbiology Results  Results for orders placed or  performed in visit on 11/16/11  Culture, blood (single)     Status: None   Collection Time: 11/16/11  6:20 PM  Result Value Ref Range Status   Micro Text Report   Final       COMMENT                   NO GROWTH AEROBICALLY/ANAEROBICALLY IN 5 DAYS   ANTIBIOTIC                                                      Culture, blood (single)     Status: None   Collection Time: 11/16/11  6:20 PM  Result Value Ref Range Status   Micro Text Report   Final       COMMENT                   NO GROWTH AEROBICALLY/ANAEROBICALLY IN 5 DAYS   ANTIBIOTIC                                                        RADIOLOGY:  No results found.      Management plans discussed with the patient, family and they are in agreement.  CODE STATUS:     Code Status Orders        Start     Ordered   08/08/15 1529  Full code   Continuous     08/08/15 1528    Code Status History    Date Active Date Inactive Code Status Order ID Comments User Context   This patient has a current code status but no historical code status.      TOTAL TIME TAKING CARE OF THIS PATIENT: 35 minutes.    Demetrios Loll M.D on 08/11/2015 at 4:25 PM  Between 7am to 6pm - Pager - [phone]  After 6pm go to [email or web address] - password [email or web address] [facility]  [facility]  Office  [phone]  CC: Primary care physician; Adin Hector, MD

## 2015-08-11 NOTE — Telephone Encounter (Signed)
Pt needs 2 week Hosp f/u with [facility]. Please advise of availablity

## 2015-08-18 NOTE — Telephone Encounter (Signed)
Spoke with pt and grand daughter and got her scheduled. Pt would only see KK and no other provider. Nothing further needed.

## 2015-08-19 ENCOUNTER — Emergency Department: Payer: Medicare HMO

## 2015-08-19 ENCOUNTER — Telehealth: Payer: Self-pay | Admitting: *Deleted

## 2015-08-19 ENCOUNTER — Emergency Department
Admission: EM | Admit: 2015-08-19 | Discharge: 2015-08-19 | Disposition: A | Discharge status: Home / Self Care | Payer: Medicare HMO | Attending: Emergency Medicine | Admitting: Emergency Medicine

## 2015-08-19 ENCOUNTER — Encounter: Payer: Self-pay | Admitting: Emergency Medicine

## 2015-08-19 DIAGNOSIS — R200 Anesthesia of skin: Secondary | ICD-10-CM | POA: Insufficient documentation

## 2015-08-19 DIAGNOSIS — R51 Headache: Secondary | ICD-10-CM | POA: Diagnosis present

## 2015-08-19 DIAGNOSIS — M545 Low back pain: Secondary | ICD-10-CM | POA: Diagnosis not present

## 2015-08-19 DIAGNOSIS — J019 Acute sinusitis, unspecified: Secondary | ICD-10-CM

## 2015-08-19 DIAGNOSIS — I10 Essential (primary) hypertension: Secondary | ICD-10-CM | POA: Insufficient documentation

## 2015-08-19 DIAGNOSIS — Z79899 Other long term (current) drug therapy: Secondary | ICD-10-CM | POA: Diagnosis not present

## 2015-08-19 DIAGNOSIS — G8929 Other chronic pain: Secondary | ICD-10-CM | POA: Diagnosis not present

## 2015-08-19 DIAGNOSIS — R531 Weakness: Secondary | ICD-10-CM | POA: Insufficient documentation

## 2015-08-19 LAB — CBC
HCT: 40.8 % (ref 35.0–47.0)
Hemoglobin: 13.5 g/dL (ref 12.0–16.0)
MCH: 31.3 pg (ref 26.0–34.0)
MCHC: 33.1 g/dL (ref 32.0–36.0)
MCV: 94.5 fL (ref 80.0–100.0)
PLATELETS: 169 10*3/uL (ref 150–440)
RBC: 4.32 MIL/uL (ref 3.80–5.20)
RDW: 16.1 % — AB (ref 11.5–14.5)
WBC: 8.2 10*3/uL (ref 3.6–11.0)

## 2015-08-19 LAB — URINALYSIS COMPLETE WITH MICROSCOPIC (ARMC ONLY)
BACTERIA UA: NONE SEEN
BILIRUBIN URINE: NEGATIVE
GLUCOSE, UA: NEGATIVE mg/dL
Ketones, ur: NEGATIVE mg/dL
LEUKOCYTES UA: NEGATIVE
Nitrite: POSITIVE — AB
Protein, ur: NEGATIVE mg/dL
Specific Gravity, Urine: 1.008 (ref 1.005–1.030)
pH: 6 (ref 5.0–8.0)

## 2015-08-19 LAB — BASIC METABOLIC PANEL
ANION GAP: 8 (ref 5–15)
BUN: 22 mg/dL — ABNORMAL HIGH (ref 6–20)
CALCIUM: 8.2 mg/dL — AB (ref 8.9–10.3)
CO2: 33 mmol/L — AB (ref 22–32)
CREATININE: 0.88 mg/dL (ref 0.44–1.00)
Chloride: 103 mmol/L (ref 101–111)
GFR, EST NON AFRICAN AMERICAN: 57 mL/min — AB (ref >60)
GLUCOSE: 149 mg/dL — AB (ref 65–99)
Potassium: 4.1 mmol/L (ref 3.5–5.1)
Sodium: 144 mmol/L (ref 135–145)

## 2015-08-19 LAB — TROPONIN I: TROPONIN I: 0.06 ng/mL — AB (ref <0.031)

## 2015-08-19 MED ORDER — AMOXICILLIN-POT CLAVULANATE 875-125 MG PO TABS: 1.0000 | ORAL_TABLET | Freq: Two times a day (BID) | ORAL | Status: DC

## 2015-08-19 NOTE — Discharge Instructions (Signed)
Weakness Weakness is a lack of strength. It may be felt all over the body (generalized) or in one specific part of the body (focal). Some causes of weakness can be serious. You may need further medical evaluation, especially if you are elderly or you have a history of immunosuppression (such as chemotherapy or HIV), kidney disease, heart disease, or diabetes. CAUSES  Weakness can be caused by many different things, including:  Infection.  Physical exhaustion.  Internal bleeding or other blood loss that results in a lack of red blood cells (anemia).  Dehydration. This cause is more common in elderly people.  Side effects or electrolyte abnormalities from medicines, such as pain medicines or sedatives.  Emotional distress, anxiety, or depression.  Circulation problems, especially severe peripheral arterial disease.  Heart disease, such as rapid atrial fibrillation, bradycardia, or heart failure.  Nervous system disorders, such as Guillain-Barr syndrome, multiple sclerosis, or stroke. DIAGNOSIS  To find the cause of your weakness, your caregiver will take your history and perform a physical exam. Lab tests or X-rays may also be ordered, if needed. TREATMENT  Treatment of weakness depends on the cause of your symptoms and can vary greatly. HOME CARE INSTRUCTIONS   Rest as needed.  Eat a well-balanced diet.  Try to get some exercise every day.  Only take over-the-counter or prescription medicines as directed by your caregiver. SEEK MEDICAL CARE IF:   Your weakness seems to be getting worse or spreads to other parts of your body.  You develop new aches or pains. SEEK IMMEDIATE MEDICAL CARE IF:   You cannot perform your normal daily activities, such as getting dressed and feeding yourself.  You cannot walk up and down stairs, or you feel exhausted when you do so.  You have shortness of breath or chest pain.  You have difficulty moving parts of your body.  You have weakness  in only one area of the body or on only one side of the body.  You have a fever.  You have trouble speaking or swallowing.  You cannot control your bladder or bowel movements.  You have black or bloody vomit or stools. MAKE SURE YOU:  Understand these instructions.  Will watch your condition.  Will get help right away if you are not doing well or get worse.   This information is not intended to replace advice given to you by your health care provider. Make sure you discuss any questions you have with your health care provider.   Document Released: 06/26/2005 Document Revised: 12/26/2011 Document Reviewed: 08/25/2011 Elsevier Interactive Patient Education 2016 [employer].  Sinusitis, Adult Sinusitis is redness, soreness, and inflammation of the paranasal sinuses. Paranasal sinuses are air pockets within the bones of your face. They are located beneath your eyes, in the middle of your forehead, and above your eyes. In healthy paranasal sinuses, mucus is able to drain out, and air is able to circulate through them by way of your nose. However, when your paranasal sinuses are inflamed, mucus and air can become trapped. This can allow bacteria and other germs to grow and cause infection. Sinusitis can develop quickly and last only a short time (acute) or continue over a long period (chronic). Sinusitis that lasts for more than 12 weeks is considered chronic. CAUSES Causes of sinusitis include:  Allergies.  Structural abnormalities, such as displacement of the cartilage that separates your nostrils (deviated septum), which can decrease the air flow through your nose and sinuses and affect sinus drainage.  Functional  abnormalities, such as when the small hairs (cilia) that line your sinuses and help remove mucus do not work properly or are not present. SIGNS AND SYMPTOMS Symptoms of acute and chronic sinusitis are the same. The primary symptoms are pain and pressure around the affected  sinuses. Other symptoms include:  Upper toothache.  Earache.  Headache.  Bad breath.  Decreased sense of smell and taste.  A cough, which worsens when you are lying flat.  Fatigue.  Fever.  Thick drainage from your nose, which often is [place] and may contain pus (purulent).  Swelling and warmth over the affected sinuses. DIAGNOSIS Your health care provider will perform a physical exam. During your exam, your health care provider may perform any of the following to help determine if you have acute sinusitis or chronic sinusitis:  Look in your nose for signs of abnormal growths in your nostrils (nasal polyps).  Tap over the affected sinus to check for signs of infection.  View the inside of your sinuses using an imaging device that has a light attached (endoscope). If your health care provider suspects that you have chronic sinusitis, one or more of the following tests may be recommended:  Allergy tests.  Nasal culture. A sample of mucus is taken from your nose, sent to a lab, and screened for bacteria.  Nasal cytology. A sample of mucus is taken from your nose and examined by your health care provider to determine if your sinusitis is related to an allergy. TREATMENT Most cases of acute sinusitis are related to a viral infection and will resolve on their own within 10 days. Sometimes, medicines are prescribed to help relieve symptoms of both acute and chronic sinusitis. These may include pain medicines, decongestants, nasal steroid sprays, or saline sprays. However, for sinusitis related to a bacterial infection, your health care provider will prescribe antibiotic medicines. These are medicines that will help kill the bacteria causing the infection. Rarely, sinusitis is caused by a fungal infection. In these cases, your health care provider will prescribe antifungal medicine. For some cases of chronic sinusitis, surgery is needed. Generally, these are cases in which sinusitis  recurs more than 3 times per year, despite other treatments. HOME CARE INSTRUCTIONS  Drink plenty of water. Water helps thin the mucus so your sinuses can drain more easily.  Use a humidifier.  Inhale steam 3-4 times a day (for example, sit in the bathroom with the shower running).  Apply a warm, moist washcloth to your face 3-4 times a day, or as directed by your health care provider.  Use saline nasal sprays to help moisten and clean your sinuses.  Take medicines only as directed by your health care provider.  If you were prescribed either an antibiotic or antifungal medicine, finish it all even if you start to feel better. SEEK IMMEDIATE MEDICAL CARE IF:  You have increasing pain or severe headaches.  You have nausea, vomiting, or drowsiness.  You have swelling around your face.  You have vision problems.  You have a stiff neck.  You have difficulty breathing.   This information is not intended to replace advice given to you by your health care provider. Make sure you discuss any questions you have with your health care provider.   Document Released: 06/26/2005 Document Revised: 07/17/2014 Document Reviewed: 07/11/2011 Elsevier Interactive Patient Education [employer].

## 2015-08-19 NOTE — ED Provider Notes (Signed)
[facility] Emergency Department Provider Note ____________________________________________  Time seen: Approximately 10:12 AM  I have reviewed the triage vital signs and the nursing notes.   HISTORY  Chief Complaint Shortness of Breath  HPI [name] is a 82 y.o. female history of pulmonary fibrosis, recent admission, hypertension.  Patient presents today with her son. They report that she is not been sleeping well for about the last 3 nights. She has had some intermittent sharp pains across the face. They come and go but the last sometimes for minutes or seconds. Denies pain over the temples. No visual changes. No fevers or chills. She reports that she is not having any trouble breathing, this is her normal breathing requiring 4 L nasal cannula. Denies productive cough. No abdominal pain nausea or vomiting. No trouble urinating. He continues to walk well. She does have pain in her right lower back, but this is been chronic in nature and associated with a slight numbness and weakness in the right lower leg which is evidently also been an ongoing problem.  Past Medical History  Diagnosis Date  . Chronic cough 02/2011    CXR with increased interstitial markings [facility] pulmonology  . Psoriasis     elbows  . Hypertension   . Arthritis   . Pelvis fracture ([place]) 2012  . History of dizziness   . Valvular heart disease   . Cervical disc disease   . Hx of appendectomy   . Hyperlipidemia   . Cancer ([place])     skin  . Pulmonary fibrosis ([place])     on 4L home oxygen    Patient Active Problem List   Diagnosis Date Noted  . Hypoxia 08/08/2015  . Mediastinal lymphadenopathy 07/15/2014  . 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 Cough 08/26/2012  . Irregular heart beat 08/26/2012  . Shortness of breath 04/05/2012  . Valvular heart disease 04/05/2012    Past Surgical History  Procedure Laterality Date  . Back surgery  2007  . Total abdominal  hysterectomy w/ bilateral salpingoophorectomy    . Skin cancer excision    . Cardiac catheterization  2006  . Cataract surgery      Current Outpatient Rx  Name  Route  Sig  Dispense  Refill  . alendronate (FOSAMAX) 70 MG tablet   Oral   Take 70 mg by mouth once a week. Take on Friday.         Marland Kitchen apixaban (ELIQUIS) 5 MG TABS tablet   Oral   Take 1 tablet (5 mg total) by mouth 2 (two) times daily.   60 tablet   0   . azelastine (ASTELIN) 0.1 % nasal spray   Each Nare   Place 1 spray into both nostrils 2 (two) times daily as needed. For congestion.      5   . bimatoprost (LUMIGAN) 0.03 % ophthalmic solution   Both Eyes   Place 1 drop into both eyes 2 (two) times daily.          . clobetasol cream (TEMOVATE) 0.05 %   Topical   Apply 1 application topically 2 (two) times daily as needed. For psoriasis.         . COMBIGAN 0.2-0.5 % ophthalmic solution   Both Eyes   Place 1 drop into both eyes 2 (two) times daily.      5   . diltiazem (CARDIZEM CD) 180 MG 24 hr capsule   Oral   Take 180 mg by mouth  daily.      10   . furosemide (LASIX) 40 MG tablet   Oral   Take 1 tablet by mouth daily.         Marland Kitchen levothyroxine (SYNTHROID, LEVOTHROID) 50 MCG tablet   Oral   Take 50 mcg by mouth daily before breakfast.         . olmesartan (BENICAR) 40 MG tablet   Oral   Take 40 mg by mouth daily.         . OXYGEN   Inhalation   Inhale 4 L into the lungs continuous.         . potassium chloride (MICRO-K) 10 MEQ CR capsule   Oral   Take 10 mEq by mouth every other day.      11   . predniSONE (DELTASONE) 10 MG tablet      40 mg po daily for 2 days, 20 mg po daily for 2 days, 10 mg po daily for 2 days.   14 tablet   0     Allergies Lipitor and Simvastatin  Family History  Problem Relation Age of Onset  . CAD Father   . CAD Mother     Social History Social History  Substance Use Topics  . Smoking status: Never Smoker   . Smokeless tobacco: Never  Used     Comment: exposed to second hand smoke  . Alcohol Use: 0.0 oz/week    0 Standard drinks or equivalent per week     Comment: occasional wine    Review of Systems Constitutional: No fever/chills Eyes: No visual changes. ENT: No sore throat. Cardiovascular: Denies chest pain. Respiratory: Denies shortness of breath. Always mildly short of breath. At her normal. Gastrointestinal: No abdominal pain.  No nausea, no vomiting.  No diarrhea.  No constipation. Genitourinary: Negative for dysuria. Musculoskeletal: Negative for back pain. Skin: Negative for rash. Neurological: Negative for headaches, focal weakness or numbness.  10-point ROS otherwise negative.  ____________________________________________   PHYSICAL EXAM:  VITAL SIGNS: ED Triage Vitals  Enc Vitals Group     BP 08/19/15 0849 141/61 mmHg     Pulse Rate 08/19/15 0849 77     Resp 08/19/15 0849 24     Temp 08/19/15 0849 98.4 F (36.9 C)     Temp Source 08/19/15 0849 Oral     SpO2 08/19/15 0849 92 %     Weight 08/19/15 0849 142 lb (64.411 kg)     Height 08/19/15 0849 4\' 10"  (1.473 m)     Head Cir --      Peak Flow --      Pain Score 08/19/15 0849 6     Pain Loc --      Pain Edu? --      Excl. in [place]? --    Constitutional: Alert and oriented. No distress. Amicable. Eyes: Conjunctivae are normal. PERRL. EOMI. no visual field cuts. Head: Atraumatic. Nose: No congestion/rhinnorhea. No tenderness over the temporal region of the scalp. Normal bilateral temporal artery pulsations. Mouth/Throat: Mucous membranes are moist.  Oropharynx non-erythematous. Neck: No stridor.   Cardiovascular: Normal rate, regular rhythm. Grossly normal heart sounds.  Good peripheral circulation. Respiratory: Very slight tachypnea. Lungs clear except for slight dry crackles in the bases bilateral. No wheezing. No accessory muscle use. Saturating approximately 95% on 4 L which is evidently her baseline. Gastrointestinal: Soft and  nontender. No distention.  Musculoskeletal: No lower extremity tenderness nor edema.  No joint effusions. Neurologic:  Normal speech and  language. No gross focal neurologic deficits are appreciated. Patient does have questionable some very mild weakness in the right lower leg with flexion and extension at the hip, patient states this is chronic and her son affirms this. No sensory deficits. No ataxia. No pronator drift. Normal cranial nerves with equal symmetric facial smile.  Skin:  Skin is warm, dry and intact. No rash noted. Psychiatric: Mood and affect are normal. Speech and behavior are normal.  ____________________________________________   LABS (all labs ordered are listed, but only abnormal results are displayed)  Labs Reviewed  CBC - Abnormal; Notable for the following:    RDW 16.1 (*)    All other components within normal limits  BASIC METABOLIC PANEL - Abnormal; Notable for the following:    CO2 33 (*)    Glucose, Bld 149 (*)    BUN 22 (*)    Calcium 8.2 (*)    GFR calc non Af Amer 57 (*)    All other components within normal limits  URINALYSIS COMPLETEWITH MICROSCOPIC ([facility] ONLY) - Abnormal; Notable for the following:    Color, Urine YELLOW (*)    APPearance CLEAR (*)    Hgb urine dipstick 2+ (*)    Nitrite POSITIVE (*)    Squamous Epithelial / LPF 0-5 (*)    All other components within normal limits  TROPONIN I - Abnormal; Notable for the following:    Troponin I 0.06 (*)    All other components within normal limits  BRAIN NATRIURETIC PEPTIDE   ____________________________________________  EKG  Reviewed and interpreted by me at 8:45 AM Normal sinus rhythm Ventricular rate 80 Probable pulmonary disease QTc 450 QRS 90 PR 150 No evidence of acute ischemic abnormalities noted. ____________________________________________  [id number]  DG Chest 2 View (Final result) Result time: 08/19/15 09:50:41   Final result by Rad Results In Interface (08/19/15 09:50:41)    Narrative:   CLINICAL DATA: Shortness of breath, weakness  EXAM: CHEST 2 VIEW  COMPARISON: 08/08/2015  FINDINGS: There is bilateral chronic interstitial thickening. There is no pleural effusion or pneumothorax. There is no focal consolidation. There is stable cardiomegaly. There is no acute osseous abnormality.  IMPRESSION: No active cardiopulmonary disease.  Chronic interstitial lung disease.   Electronically Signed By: Kathreen Devoid On: 08/19/2015 09:50      ____________________________________________   PROCEDURES  Procedure(s) performed: None  Critical Care performed: No  ____________________________________________   INITIAL IMPRESSION / ASSESSMENT AND PLAN / ED COURSE  Pertinent labs & imaging results that were available during my care of the patient were reviewed by me and considered in my medical decision making (see chart for details).  The patient is for evaluation of intermittent sharp headaches. In addition she has a recent hospitalization, but denies any respiratory symptoms. She is on her normal oxygen saturation and given 4 L nasal cannula. She's not had any chest pain. No fevers or chills. Labs performed and are reassuring, troponin slightly elevated but actually appears to be at her baseline with no ischemic changes noted on her EKG.  CT the head does reveal bilateral small mastoid fluid collections. On reevaluation the patient does not have any mastoid tenderness, no overlying erythema, and somatic membranes and canals are normal bilaterally. At 11 AM the patient reports she is no longer having any headaches and has no complaints at this time. She is awake alert and appears to be at her normal baseline status. I called and spoke with her primary care doctor Dr. Caryl Comes. Patient or family  are aware that she has appointment on the 14th in all follow closely. I will place her on antibiotic for sinusitis, and she'll follow up closely with  primary.  Return precautions advised. Family driving the patient home. ____________________________________________   FINAL CLINICAL IMPRESSION(S) / ED DIAGNOSES  Final diagnoses:  Weakness      Delman Kitten, MD 08/19/15 1112

## 2015-08-19 NOTE — ED Notes (Addendum)
MD at bedside, states for the past 3 days pt has had sharp shooting pains in her head, states feeling weak, pt was admitted to hospital last week, pt on 4L [place], family at bedside, pt awake and alert, states she has not been sleeping well

## 2015-08-19 NOTE — ED Notes (Signed)
Dr. Jacqualine Code notified of critical trop 0.06

## 2015-08-19 NOTE — Telephone Encounter (Signed)
Tried to call son to get more information. No answer. Pt has an appt 08/27/15 next week with [facility] for hosp f/u. Was in hospital for hypoxia, CHF and pulm fibrosis. Last ov was 03/12/15 with [facility]. Note below states that pt had issues with sleep in the hospital and son is asking if we can give her something to help her sleep at night. Please advise. Thanks.

## 2015-08-19 NOTE — Telephone Encounter (Signed)
Pt son calling stating she was in hospital last week And next week she has a fu with [place] But while being in there she had issues with sleep He told them that he would call them after talking to Sleep Doctor And would try and get her on medication that would help with that  Please advise.

## 2015-08-19 NOTE — ED Notes (Signed)
Pt here with c/o shob. Pt on 4L continuous oxygen. Pt with recent admission, new onset afib (last week), started eliquid. C/o headache this am, hasn't sleep the past 3 nights.

## 2015-08-19 NOTE — ED Notes (Signed)
Patient transported to X-ray 

## 2015-08-20 ENCOUNTER — Other Ambulatory Visit: Payer: Self-pay

## 2015-08-20 MED ORDER — ZOLPIDEM TARTRATE 10 MG PO TABS: 10.0000 mg | ORAL_TABLET | Freq: Every day | ORAL | Status: DC

## 2015-08-20 MED ORDER — ZOLPIDEM TARTRATE 5 MG PO TABS: 5.0000 mg | ORAL_TABLET | Freq: Every day | ORAL | Status: AC

## 2015-08-20 NOTE — Telephone Encounter (Signed)
This should be addressed by her primary MD or @ visit with Dr Weston Anna

## 2015-08-20 NOTE — Telephone Encounter (Signed)
Called and spoke with pt. Pt. inform of rx ambien 5mg . rx sent to [facility]. Nothing further needed.

## 2015-08-20 NOTE — Telephone Encounter (Signed)
Left message with pt. Son greg to inform him of rx being sent to [facility]

## 2015-08-20 NOTE — Telephone Encounter (Signed)
She can be tried on ambien 5 mg qhs.

## 2015-08-20 NOTE — Addendum Note (Signed)
Addended by: Maryanna Shape A on: 08/20/2015 12:49 PM   Modules accepted: Orders

## 2015-08-20 NOTE — Telephone Encounter (Signed)
Son states he spoke with Mortimer Fries when pt was in hospital and states that [place] told him that he was going to speak with you about what the pt could take to help her sleep and get back in touch with son. Son states he is following up because he hasn't heard back. Please advise.

## 2015-08-22 ENCOUNTER — Emergency Department: Payer: Medicare HMO

## 2015-08-22 ENCOUNTER — Encounter: Payer: Self-pay | Admitting: Emergency Medicine

## 2015-08-22 ENCOUNTER — Emergency Department
Admission: EM | Admit: 2015-08-22 | Discharge: 2015-08-22 | Disposition: A | Discharge status: Home / Self Care | Payer: Medicare HMO | Attending: Emergency Medicine | Admitting: Emergency Medicine

## 2015-08-22 DIAGNOSIS — I10 Essential (primary) hypertension: Secondary | ICD-10-CM | POA: Insufficient documentation

## 2015-08-22 DIAGNOSIS — N390 Urinary tract infection, site not specified: Secondary | ICD-10-CM | POA: Diagnosis not present

## 2015-08-22 DIAGNOSIS — Y9289 Other specified places as the place of occurrence of the external cause: Secondary | ICD-10-CM | POA: Diagnosis not present

## 2015-08-22 DIAGNOSIS — Y9389 Activity, other specified: Secondary | ICD-10-CM | POA: Diagnosis not present

## 2015-08-22 DIAGNOSIS — Y998 Other external cause status: Secondary | ICD-10-CM | POA: Diagnosis not present

## 2015-08-22 DIAGNOSIS — R319 Hematuria, unspecified: Secondary | ICD-10-CM

## 2015-08-22 DIAGNOSIS — Z7901 Long term (current) use of anticoagulants: Secondary | ICD-10-CM | POA: Insufficient documentation

## 2015-08-22 DIAGNOSIS — Z79899 Other long term (current) drug therapy: Secondary | ICD-10-CM | POA: Diagnosis not present

## 2015-08-22 DIAGNOSIS — W010XXA Fall on same level from slipping, tripping and stumbling without subsequent striking against object, initial encounter: Secondary | ICD-10-CM | POA: Insufficient documentation

## 2015-08-22 DIAGNOSIS — Z792 Long term (current) use of antibiotics: Secondary | ICD-10-CM | POA: Diagnosis not present

## 2015-08-22 DIAGNOSIS — S6991XA Unspecified injury of right wrist, hand and finger(s), initial encounter: Secondary | ICD-10-CM | POA: Diagnosis present

## 2015-08-22 DIAGNOSIS — E860 Dehydration: Secondary | ICD-10-CM

## 2015-08-22 DIAGNOSIS — G8929 Other chronic pain: Secondary | ICD-10-CM | POA: Diagnosis not present

## 2015-08-22 DIAGNOSIS — M19031 Primary osteoarthritis, right wrist: Secondary | ICD-10-CM | POA: Diagnosis not present

## 2015-08-22 DIAGNOSIS — S79912A Unspecified injury of left hip, initial encounter: Secondary | ICD-10-CM | POA: Diagnosis not present

## 2015-08-22 DIAGNOSIS — M19032 Primary osteoarthritis, left wrist: Secondary | ICD-10-CM | POA: Diagnosis not present

## 2015-08-22 LAB — URINALYSIS COMPLETE WITH MICROSCOPIC (ARMC ONLY)
Bacteria, UA: NONE SEEN
Bilirubin Urine: NEGATIVE
GLUCOSE, UA: NEGATIVE mg/dL
KETONES UR: NEGATIVE mg/dL
NITRITE: NEGATIVE
Protein, ur: NEGATIVE mg/dL
SPECIFIC GRAVITY, URINE: 1.011 (ref 1.005–1.030)
pH: 5 (ref 5.0–8.0)

## 2015-08-22 LAB — BASIC METABOLIC PANEL
ANION GAP: 7 (ref 5–15)
BUN: 26 mg/dL — ABNORMAL HIGH (ref 6–20)
CO2: 34 mmol/L — ABNORMAL HIGH (ref 22–32)
Calcium: 8.7 mg/dL — ABNORMAL LOW (ref 8.9–10.3)
Chloride: 102 mmol/L (ref 101–111)
Creatinine, Ser: 1.75 mg/dL — ABNORMAL HIGH (ref 0.44–1.00)
GFR calc Af Amer: 29 mL/min — ABNORMAL LOW (ref >60)
GFR, EST NON AFRICAN AMERICAN: 25 mL/min — AB (ref >60)
GLUCOSE: 114 mg/dL — AB (ref 65–99)
POTASSIUM: 4.4 mmol/L (ref 3.5–5.1)
SODIUM: 143 mmol/L (ref 135–145)

## 2015-08-22 LAB — CBC
HCT: 39.9 % (ref 35.0–47.0)
Hemoglobin: 12.8 g/dL (ref 12.0–16.0)
MCH: 30.4 pg (ref 26.0–34.0)
MCHC: 32.1 g/dL (ref 32.0–36.0)
MCV: 94.6 fL (ref 80.0–100.0)
PLATELETS: 173 10*3/uL (ref 150–440)
RBC: 4.22 MIL/uL (ref 3.80–5.20)
RDW: 16.3 % — ABNORMAL HIGH (ref 11.5–14.5)
WBC: 12.8 10*3/uL — AB (ref 3.6–11.0)

## 2015-08-22 LAB — TROPONIN I: TROPONIN I: 0.04 ng/mL — AB (ref <0.031)

## 2015-08-22 MED ORDER — SODIUM CHLORIDE 0.9 % IV BOLUS (SEPSIS): 500.0000 mL | Freq: Once | INTRAVENOUS | Status: DC

## 2015-08-22 MED ORDER — CEPHALEXIN 500 MG PO CAPS: 500.0000 mg | ORAL_CAPSULE | Freq: Two times a day (BID) | ORAL | Status: DC

## 2015-08-22 MED ORDER — SODIUM CHLORIDE 0.9 % IV BOLUS (SEPSIS)
1000.0000 mL | Freq: Once | INTRAVENOUS | Status: AC
  Administered 2015-08-22: 1000 mL via INTRAVENOUS

## 2015-08-22 NOTE — ED Notes (Signed)
Discussed IV options with patient, AC placement preferred. 

## 2015-08-22 NOTE — ED Notes (Signed)
Per EMS, patient has has two falls in the last couple of days.  Patient fell tonight onto her hand and is complaining of right thumb pain.  She states she keeps tripping over her oxygen tank tube which stretches throughout her house.  She denies losing consciousness during these two falls, and denies having any confusion, pains, dizziness,or weakness just prior to these falls.  She is AOx4 and is a good historian.

## 2015-08-22 NOTE — Discharge Instructions (Signed)
Dehydration, Adult °Dehydration is a condition in which you do not have enough fluid or water in your body. It happens when you take in less fluid than you lose. Vital organs such as the kidneys, brain, and heart cannot function without a proper amount of fluids. Any loss of fluids from the body can cause dehydration.  °Dehydration can range from mild to severe. This condition should be treated right away to help prevent it from becoming severe. °CAUSES  °This condition may be caused by: °· Vomiting. °· Diarrhea. °· Excessive sweating, such as when exercising in hot or humid weather. °· Not drinking enough fluid during strenuous exercise or during an illness. °· Excessive urine output. °· Fever. °· Certain medicines. °RISK FACTORS °This condition is more likely to develop in: °· People who are taking certain medicines that cause the body to lose excess fluid (diuretics).   °· People who have a chronic illness, such as diabetes, that may increase urination. °· Older adults.   °· People who live at high altitudes.   °· People who participate in endurance sports.   °SYMPTOMS  °Mild Dehydration °· Thirst. °· Dry lips. °· Slightly dry mouth. °· Dry, warm skin. °Moderate Dehydration °· Very dry mouth.   °· Muscle cramps.   °· Dark urine and decreased urine production.   °· Decreased tear production.   °· Headache.   °· Light-headedness, especially when you stand up from a sitting position.   °Severe Dehydration °· Changes in skin.   °¨ Cold and clammy skin.   °¨ Skin does not spring back quickly when lightly pinched and released.   °· Changes in body fluids.   °¨ Extreme thirst.   °¨ No tears.   °¨ Not able to sweat when body temperature is high, such as in hot weather.   °¨ Minimal urine production.   °· Changes in vital signs.   °¨ Rapid, weak pulse (more than 100 beats per minute when you are sitting still).   °¨ Rapid breathing.   °¨ Low blood pressure.   °· Other changes.   °¨ Sunken eyes.   °¨ Cold hands and feet.    °¨ Confusion. °¨ Lethargy and difficulty being awakened. °¨ Fainting (syncope).   °¨ Short-term weight loss.   °¨ Unconsciousness. °DIAGNOSIS  °This condition may be diagnosed based on your symptoms. You may also have tests to determine how severe your dehydration is. These tests may include:  °· Urine tests.   °· Blood tests.   °TREATMENT  °Treatment for this condition depends on the severity. Mild or moderate dehydration can often be treated at home. Treatment should be started right away. Do not wait until dehydration becomes severe. Severe dehydration needs to be treated at the hospital. °Treatment for Mild Dehydration °· Drinking plenty of water to replace the fluid you have lost.   °· Replacing minerals in your blood (electrolytes) that you may have lost.   °Treatment for Moderate Dehydration  °· Consuming oral rehydration solution (ORS). °Treatment for Severe Dehydration °· Receiving fluid through an IV tube.   °· Receiving electrolyte solution through a feeding tube that is passed through your nose and into your stomach (nasogastric tube or NG tube). °· Correcting any abnormalities in electrolytes. °HOME CARE INSTRUCTIONS  °· Drink enough fluid to keep your urine clear or pale yellow.   °· Drink water or fluid slowly by taking small sips. You can also try sucking on ice cubes.  °· Have food or beverages that contain electrolytes. Examples include bananas and sports drinks. °· Take over-the-counter and prescription medicines only as told by your health care provider.   °· Prepare ORS according to the manufacturer's instructions. Take sips   of ORS every 5 minutes until your urine returns to normal.  If you have vomiting or diarrhea, continue to try to drink water, ORS, or both.   If you have diarrhea, avoid:   Beverages that contain caffeine.   Fruit juice.   Milk.   Carbonated soft drinks.  Do not take salt tablets. This can lead to the condition of having too much sodium in your body  (hypernatremia).  SEEK MEDICAL CARE IF:  You cannot eat or drink without vomiting.  You have had moderate diarrhea during a period of more than 24 hours.  You have a fever. SEEK IMMEDIATE MEDICAL CARE IF:   You have extreme thirst.  You have severe diarrhea.  You have not urinated in 6-8 hours, or you have urinated only a small amount of very dark urine.  You have shriveled skin.  You are dizzy, confused, or both.   This information is not intended to replace advice given to you by your health care provider. Make sure you discuss any questions you have with your health care provider.   Document Released: 06/26/2005 Document Revised: 03/17/2015 Document Reviewed: 11/11/2014 Elsevier Interactive Patient Education 2016 Elsevier Inc.  Hematuria, Adult Hematuria is blood in your urine. It can be caused by a bladder infection, kidney infection, prostate infection, kidney stone, or cancer of your urinary tract. Infections can usually be treated with medicine, and a kidney stone usually will pass through your urine. If neither of these is the cause of your hematuria, further workup to find out the reason may be needed. It is very important that you tell your health care provider about any blood you see in your urine, even if the blood stops without treatment or happens without causing pain. Blood in your urine that happens and then stops and then happens again can be a symptom of a very serious condition. Also, pain is not a symptom in the initial stages of many urinary cancers. HOME CARE INSTRUCTIONS   Drink lots of fluid, 3-4 quarts a day. If you have been diagnosed with an infection, cranberry juice is especially recommended, in addition to large amounts of water.  Avoid caffeine, tea, and carbonated beverages because they tend to irritate the bladder.  Avoid alcohol because it may irritate the prostate.  Take all medicines as directed by your health care provider.  If you were  prescribed an antibiotic medicine, finish it all even if you start to feel better.  If you have been diagnosed with a kidney stone, follow your health care provider's instructions regarding straining your urine to catch the stone.  Empty your bladder often. Avoid holding urine for long periods of time.  After a bowel movement, women should cleanse front to back. Use each tissue only once.  Empty your bladder before and after sexual intercourse if you are a female. SEEK MEDICAL CARE IF:  You develop back pain.  You have a fever.  You have a feeling of sickness in your stomach (nausea) or vomiting.  Your symptoms are not better in 3 days. Return sooner if you are getting worse. SEEK IMMEDIATE MEDICAL CARE IF:   You develop severe vomiting and are unable to keep the medicine down.  You develop severe back or abdominal pain despite taking your medicines.  You begin passing a large amount of blood or clots in your urine.  You feel extremely weak or faint, or you pass out. MAKE SURE YOU:   Understand these instructions.  Will watch your  condition.  Will get help right away if you are not doing well or get worse.   This information is not intended to replace advice given to you by your health care provider. Make sure you discuss any questions you have with your health care provider.   Document Released: 06/26/2005 Document Revised: 07/17/2014 Document Reviewed: 02/24/2013 Elsevier Interactive Patient Education 2016 Elsevier Inc.  Osteoarthritis Osteoarthritis is a disease that causes soreness and inflammation of a joint. It occurs when the cartilage at the affected joint wears down. Cartilage acts as a cushion, covering the ends of bones where they meet to form a joint. Osteoarthritis is the most common form of arthritis. It often occurs in older people. The joints affected most often by this condition include those in the:  Ends of the fingers.  Thumbs.  Neck.  Lower  back.  Knees.  Hips. CAUSES  Over time, the cartilage that covers the ends of bones begins to wear away. This causes bone to rub on bone, producing pain and stiffness in the affected joints.  RISK FACTORS Certain factors can increase your chances of having osteoarthritis, including:  Older age.  Excessive body weight.  Overuse of joints.  Previous joint injury. SIGNS AND SYMPTOMS   Pain, swelling, and stiffness in the joint.  Over time, the joint may lose its normal shape.  Small deposits of bone (osteophytes) may grow on the edges of the joint.  Bits of bone or cartilage can break off and float inside the joint space. This may cause more pain and damage. DIAGNOSIS  Your health care provider will do a physical exam and ask about your symptoms. Various tests may be ordered, such as:  X-rays of the affected joint.  Blood tests to rule out other types of arthritis. Additional tests may be used to diagnose your condition. TREATMENT  Goals of treatment are to control pain and improve joint function. Treatment plans may include:  A prescribed exercise program that allows for rest and joint relief.  A weight control plan.  Pain relief techniques, such as:  Properly applied heat and cold.  Electric pulses delivered to nerve endings under the skin (transcutaneous electrical nerve stimulation [TENS]).  Massage.  Certain nutritional supplements.  Medicines to control pain, such as:  Acetaminophen.  Nonsteroidal anti-inflammatory drugs (NSAIDs), such as naproxen.  Narcotic or central-acting agents, such as tramadol.  Corticosteroids. These can be given orally or as an injection.  Surgery to reposition the bones and relieve pain (osteotomy) or to remove loose pieces of bone and cartilage. Joint replacement may be needed in advanced states of osteoarthritis. HOME CARE INSTRUCTIONS   Take medicines only as directed by your health care provider.  Maintain a healthy  weight. Follow your health care provider's instructions for weight control. This may include dietary instructions.  Exercise as directed. Your health care provider can recommend specific types of exercise. These may include:  Strengthening exercises. These are done to strengthen the muscles that support joints affected by arthritis. They can be performed with weights or with exercise bands to add resistance.  Aerobic activities. These are exercises, such as brisk walking or low-impact aerobics, that get your heart pumping.  Range-of-motion activities. These keep your joints limber.  Balance and agility exercises. These help you maintain daily living skills.  Rest your affected joints as directed by your health care provider.  Keep all follow-up visits as directed by your health care provider. SEEK MEDICAL CARE IF:   Your skin turns red.  You develop a rash in addition to your joint pain.  You have worsening joint pain.  You have a fever along with joint or muscle aches. SEEK IMMEDIATE MEDICAL CARE IF:  You have a significant loss of weight or appetite.  You have night sweats. [place] of Arthritis and Musculoskeletal and Skin Diseases: www.niams.SouthExposed.es  [employer] on Aging: http://kim-miller.com/  American College of Rheumatology: www.rheumatology.org   This information is not intended to replace advice given to you by your health care provider. Make sure you discuss any questions you have with your health care provider.   Document Released: 06/26/2005 Document Revised: 07/17/2014 Document Reviewed: 03/03/2013 Elsevier Interactive Patient Education 2016 Elsevier Inc.  Urinary Tract Infection Urinary tract infections (UTIs) can develop anywhere along your urinary tract. Your urinary tract is your body's drainage system for removing wastes and extra water. Your urinary tract includes two kidneys, two ureters, a bladder, and a urethra. Your  kidneys are a pair of bean-shaped organs. Each kidney is about the size of your fist. They are located below your ribs, one on each side of your spine. CAUSES Infections are caused by microbes, which are microscopic organisms, including fungi, viruses, and bacteria. These organisms are so small that they can only be seen through a microscope. Bacteria are the microbes that most commonly cause UTIs. SYMPTOMS  Symptoms of UTIs may vary by age and gender of the patient and by the location of the infection. Symptoms in young women typically include a frequent and intense urge to urinate and a painful, burning feeling in the bladder or urethra during urination. Older women and men are more likely to be tired, shaky, and weak and have muscle aches and abdominal pain. A fever may mean the infection is in your kidneys. Other symptoms of a kidney infection include pain in your back or sides below the ribs, nausea, and vomiting. DIAGNOSIS To diagnose a UTI, your caregiver will ask you about your symptoms. Your caregiver will also ask you to provide a urine sample. The urine sample will be tested for bacteria and white blood cells. White blood cells are made by your body to help fight infection. TREATMENT  Typically, UTIs can be treated with medication. Because most UTIs are caused by a bacterial infection, they usually can be treated with the use of antibiotics. The choice of antibiotic and length of treatment depend on your symptoms and the type of bacteria causing your infection. HOME CARE INSTRUCTIONS  If you were prescribed antibiotics, take them exactly as your caregiver instructs you. Finish the medication even if you feel better after you have only taken some of the medication.  Drink enough water and fluids to keep your urine clear or pale yellow.  Avoid caffeine, tea, and carbonated beverages. They tend to irritate your bladder.  Empty your bladder often. Avoid holding urine for long periods of  time.  Empty your bladder before and after sexual intercourse.  After a bowel movement, women should cleanse from front to back. Use each tissue only once. SEEK MEDICAL CARE IF:   You have back pain.  You develop a fever.  Your symptoms do not begin to resolve within 3 days. SEEK IMMEDIATE MEDICAL CARE IF:   You have severe back pain or lower abdominal pain.  You develop chills.  You have nausea or vomiting.  You have continued burning or discomfort with urination. MAKE SURE YOU:   Understand these instructions.  Will watch your condition.  Will get help right away if you are not doing well or get worse.   This information is not intended to replace advice given to you by your health care provider. Make sure you discuss any questions you have with your health care provider.   Document Released: 04/05/2005 Document Revised: 03/17/2015 Document Reviewed: 08/04/2011 Elsevier Interactive Patient Education [employer].

## 2015-08-22 NOTE — ED Notes (Signed)
Report from David, RN

## 2015-08-22 NOTE — ED Notes (Signed)
Pt assisted to bedside commode to urinate, provided sample and helped back into bed at this time.

## 2015-08-22 NOTE — ED Provider Notes (Signed)
[facility] Emergency Department Provider Note  ____________________________________________  Time seen: 7:05 PM  I have reviewed the triage vital signs and the nursing notes.   HISTORY  Chief Complaint Fall and Hand Pain    HPI [name] is a 82 y.o. female comes to the ED due to pain in the right thumb after a fall at home. She has fallen twice recently due to tripping over her tubing for her supplemental oxygen that she wears by nasal cannula for pulmonary fibrosis. She is overall been feeling well. Denies any dysuria frequency urgency cough chest pain shortness of breath.     Past Medical History  Diagnosis Date  . Chronic cough 02/2011    CXR with increased interstitial markings [facility] pulmonology  . Psoriasis     elbows  . Hypertension   . Arthritis   . Pelvis fracture ([place]) 2012  . History of dizziness   . Valvular heart disease   . Cervical disc disease   . Hx of appendectomy   . Hyperlipidemia   . Cancer ([place])     skin  . Pulmonary fibrosis ([place])     on 4L home oxygen     Patient Active Problem List   Diagnosis Date Noted  . Hypoxia 08/08/2015  . Mediastinal lymphadenopathy 07/15/2014  . 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 Cough 08/26/2012  . Irregular heart beat 08/26/2012  . Shortness of breath 04/05/2012  . Valvular heart disease 04/05/2012     Past Surgical History  Procedure Laterality Date  . Back surgery  2007  . Total abdominal hysterectomy w/ bilateral salpingoophorectomy    . Skin cancer excision    . Cardiac catheterization  2006  . Cataract surgery       Current Outpatient Rx  Name  Route  Sig  Dispense  Refill  . alendronate (FOSAMAX) 70 MG tablet   Oral   Take 70 mg by mouth once a week. Take on Friday.         Marland Kitchen amoxicillin-clavulanate (AUGMENTIN) 875-125 MG tablet   Oral   Take 1 tablet by mouth 2 (two) times daily.   20 tablet   0   . apixaban (ELIQUIS) 5 MG TABS tablet   Oral   Take 1 tablet (5 mg total) by mouth 2 (two) times daily.   60 tablet   0   . azelastine (ASTELIN) 0.1 % nasal spray   Each Nare   Place 1 spray into both nostrils 2 (two) times daily as needed. For congestion.      5   . bimatoprost (LUMIGAN) 0.03 % ophthalmic solution   Both Eyes   Place 1 drop into both eyes 2 (two) times daily.          . clobetasol cream (TEMOVATE) 0.05 %   Topical   Apply 1 application topically 2 (two) times daily as needed. For psoriasis.         . COMBIGAN 0.2-0.5 % ophthalmic solution   Both Eyes   Place 1 drop into both eyes 2 (two) times daily.      5   . diltiazem (CARDIZEM CD) 180 MG 24 hr capsule   Oral   Take 180 mg by mouth daily.      10   . furosemide (LASIX) 40 MG tablet   Oral   Take 1 tablet by mouth daily.         Marland Kitchen levothyroxine (SYNTHROID, LEVOTHROID) 50 MCG tablet  Oral   Take 50 mcg by mouth daily before breakfast.         . olmesartan (BENICAR) 40 MG tablet   Oral   Take 40 mg by mouth daily.         . OXYGEN   Inhalation   Inhale 4 L into the lungs continuous.         . potassium chloride (MICRO-K) 10 MEQ CR capsule   Oral   Take 10 mEq by mouth every other day.      11   . zolpidem (AMBIEN) 5 MG tablet   Oral   Take 1 tablet (5 mg total) by mouth at bedtime.   30 tablet   0   . predniSONE (DELTASONE) 10 MG tablet      40 mg po daily for 2 days, 20 mg po daily for 2 days, 10 mg po daily for 2 days.   14 tablet   0      Allergies Lipitor and Simvastatin   Family History  Problem Relation Age of Onset  . CAD Father   . CAD Mother     Social History Social History  Substance Use Topics  . Smoking status: Never Smoker   . Smokeless tobacco: Never Used     Comment: exposed to second hand smoke  . Alcohol Use: 0.0 oz/week    0 Standard drinks or equivalent per week     Comment: occasional wine    Review of Systems  Constitutional:   No fever or chills. No weight  changes Eyes:   No blurry vision or double vision.  ENT:   No sore throat. Cardiovascular:   No chest pain. Respiratory:   No dyspnea or cough. Gastrointestinal:   Negative for abdominal pain, vomiting and diarrhea.  No BRBPR or melena. Genitourinary:   Negative for dysuria, urinary retention, bloody urine, or difficulty urinating. Musculoskeletal:   Negative for back pain. Chronic left wrist pain x 4 years. Right thumb pain. Mild left hip pain.  Skin:   Negative for rash. Neurological:   Negative for headaches, focal weakness or numbness. Psychiatric:  No anxiety or depression.   Endocrine:  No hot/cold intolerance, changes in energy, or sleep difficulty.  10-point ROS otherwise negative.  ____________________________________________   PHYSICAL EXAM:  VITAL SIGNS: ED Triage Vitals  Enc Vitals Group     BP 08/22/15 0530 103/67 mmHg     Pulse Rate 08/22/15 0524 72     Resp 08/22/15 0524 19     Temp 08/22/15 0524 97.5 F (36.4 C)     Temp Source 08/22/15 0524 Oral     SpO2 08/22/15 0524 95 %     Weight --      Height --      Head Cir --      Peak Flow --      Pain Score 08/22/15 0525 6     Pain Loc --      Pain Edu? --      Excl. in [place]? --     Vital signs reviewed, nursing assessments reviewed.   Constitutional:   Alert and oriented. Well appearing and in no distress. Eyes:   No scleral icterus. No conjunctival pallor. PERRL. EOMI ENT   Head:   Normocephalic and atraumatic.   Nose:   No congestion/rhinnorhea. No septal hematoma   Mouth/Throatdry mucous membranes, no pharyngeal erythema. No peritonsillar mass. No uvula shift.   Neck:   No stridor. No SubQ emphysema. No  meningismus. Hematological/Lymphatic/Immunilogical:   No cervical lymphadenopathy. Cardiovascular:   RRR. Normal and symmetric distal pulses are present in all extremities. No murmurs, rubs, or gallops. Respiratory:   Normal respiratory effort without tachypnea nor retractions.  diffuse  Velcro like crackles in bilateral lungs  Gastrorointestinal:   Soft and nontender. No distention. There is no CVA tenderness.  No rebound, rigidity, or guarding. Genitourinary:   deferred Musculoskeletal:   right-hand normal without any swelling or tenderness. No ecchymosis. Left hand and wrist do not have any swelling or deformity. There is some tenderness at the left thumb..  No edema. hip has full range of motion nontender.  Neurologic:   Normal speech and language.  CN 2-10 normal. Motor grossly intact. No pronator drift.  Normal gait. No gross focal neurologic deficits are appreciated.  Skin:    Skin is warm, dry and intact. No rash noted.  No petechiae, purpura, or bullae. Psychiatric:   Mood and affect are normal. Speech and behavior are normal. Patient exhibits appropriate insight and judgment.  ____________________________________________    LABS (pertinent positives/negatives) (all labs ordered are listed, but only abnormal results are displayed) Labs Reviewed  CBC - Abnormal; Notable for the following:    WBC 12.8 (*)    RDW 16.3 (*)    All other components within normal limits  BASIC METABOLIC PANEL - Abnormal; Notable for the following:    CO2 34 (*)    Glucose, Bld 114 (*)    BUN 26 (*)    Creatinine, Ser 1.75 (*)    Calcium 8.7 (*)    GFR calc non Af Amer 25 (*)    GFR calc Af Amer 29 (*)    All other components within normal limits  TROPONIN I - Abnormal; Notable for the following:    Troponin I 0.04 (*)    All other components within normal limits  URINALYSIS COMPLETEWITH MICROSCOPIC ([facility] ONLY) - Abnormal; Notable for the following:    Color, Urine YELLOW (*)    APPearance CLEAR (*)    Hgb urine dipstick 3+ (*)    Leukocytes, UA TRACE (*)    Squamous Epithelial / LPF 0-5 (*)    All other components within normal limits   ____________________________________________   Interpreted by me  Date: 08/22/2015  Rate: 59  Rhythm: normal sinus rhythm  QRS  Axis: normal  Intervals: normal  ST/T Wave abnormalities: normal  Conduction Disutrbances: none  Narrative Interpretation: unremarkable    __________    RADIOLChest x-ray consistent with stable pulmonary fibrosis. No acute findings__________   PROCEDURES   ____________________________________________   INITIAL IMPRESSION / ASSESSMENT AND PLAN / ED COURSE  Pertinent labs & imaging results that were available during my care of the patient were reviewed by me and considered in my medical decision making (see chart for details). Patient well-appearing no acute distress. Neck complains of chronic left wrist pain and acute right thumb pain. We'll x-ray both. Also appears to be dehydrated on labs. We'll start Keflex for urinary tract infection with hematuria and have her follow-up closely. Urine culture sent as well. IV rehydration, as the patient is taking Lasix but does not eat or drink much due to low appetite. I counseled her that she needs to eat and drink regularly according to her diet plan rest discussed with her doctor cutting back on the Lasix. We'll have her hold for now. Low suspicion for any acute neurovascular process such as stroke intracranial hemorrhage meningitis encephalitis or sepsis. __________  FINAL CLINICAL IMPRESSION(S) / ED DIAGNOSES  Final diagnoses:  Osteoarthritis of both wrists, unspecified osteoarthritis type  Dehydration  Urinary tract infection with hematuria, site unspecified      Carrie Mew, MD 08/22/15 [phone]

## 2015-08-23 LAB — URINE CULTURE: Culture: NO GROWTH

## 2015-08-27 ENCOUNTER — Encounter (INDEPENDENT_AMBULATORY_CARE_PROVIDER_SITE_OTHER): Payer: Self-pay

## 2015-08-27 ENCOUNTER — Telehealth: Payer: Self-pay | Admitting: Internal Medicine

## 2015-08-27 ENCOUNTER — Ambulatory Visit (INDEPENDENT_AMBULATORY_CARE_PROVIDER_SITE_OTHER): Payer: Medicare HMO | Admitting: Internal Medicine

## 2015-08-27 ENCOUNTER — Encounter: Payer: Self-pay | Admitting: Internal Medicine

## 2015-08-27 VITALS — BP 132/74 | HR 97 | Ht 60.0 in | Wt 143.0 lb

## 2015-08-27 DIAGNOSIS — J40 Bronchitis, not specified as acute or chronic: Secondary | ICD-10-CM | POA: Insufficient documentation

## 2015-08-27 DIAGNOSIS — J8410 Pulmonary fibrosis, unspecified: Secondary | ICD-10-CM

## 2015-08-27 MED ORDER — ARFORMOTEROL TARTRATE 15 MCG/2ML IN NEBU: 15.0000 ug | INHALATION_SOLUTION | Freq: Three times a day (TID) | RESPIRATORY_TRACT | Status: AC

## 2015-08-27 MED ORDER — ARFORMOTEROL TARTRATE 15 MCG/2ML IN NEBU: 15.0000 ug | INHALATION_SOLUTION | Freq: Four times a day (QID) | RESPIRATORY_TRACT | Status: DC

## 2015-08-27 MED ORDER — BUDESONIDE 0.5 MG/2ML IN SUSP: 0.5000 mg | Freq: Two times a day (BID) | RESPIRATORY_TRACT | Status: AC

## 2015-08-27 MED ORDER — BUDESONIDE 0.5 MG/2ML IN SUSP: 0.5000 mg | Freq: Two times a day (BID) | RESPIRATORY_TRACT | Status: DC

## 2015-08-27 NOTE — Telephone Encounter (Signed)
Had long conversation her son who called in regards to his moms vist today with [place]. He had questions to regards in pt not driving any longer, about her health and his wife stating that [place] had mentioned Hospice to them today. He has asked if I can find out about any help they can get for someone to stay with his mom that medicare will help pay for. Informed pt I will contact him back on Monday with any information I can get.

## 2015-08-27 NOTE — Telephone Encounter (Signed)
LM on VM for pt's son (EC) Daisia Tunnicliff to call back.

## 2015-08-27 NOTE — Progress Notes (Addendum)
Subjective:    Patient ID: [name], female    DOB: 1923-10-05, 81 y.o.   MRN: [id number]  Synopsis: First seen by [facility] in [id number] for ILD of uncertain etiology previously followed by [facility].   She regularly works out in her basement with a [employer]. 06 04 2013: 6-minute walk, 700 feet, 90% on 3 liters, 80% on 3 liters.  03/05/2012, 6-minute walk, 732 feet, 92% on 2 liters 2013 Spirogram reveals an FVC of 75% pre, 77% post, FEV1 89% pre, 91% post, 25-75 237% pre, 287% post consistent with a mild restrictive ventilatory defect and no definite improvement with bronchodilator therapy.  11/2013 PFT [facility] > Ratio 89%, FEV1 0.99 (82% pred, no change with BD), TLC 2.96L (77% pred), DLCO 5.8 (37% pred)  HPI Chief Complaint  Patient presents with  . Hospitalization Follow-up    pt. states breathing is baseline. occ. dry cough. denies SOB,wheezing or chest pain/tightness. on 4L 02   She had recent hospitalization fro acute resp failure and acute Afib withRVR Patient with chronic hypoxic resp failure, but seems to be sitting comfortably, daughter and daughter in law at visit Patient resp status is very poor, I have discussed her prognosis and I have address code status and she understands that when its her time to pass, she will want to die peacefully when the [place] calls her,  without any artifical support and machines. I have entered DNR status  I HAVE HAD A LONG DISCUSSION WITH PATIENT REGARDING GETTING PFTS AND CT CHEST TO FOLLOW UP ADENOPATHY-PATIENT HAS DECIDED THAT SHE WILL NOT PURSUE ANY FURTHER TESTING  SHE DOES NOT WANT ANY INVASIVE PROCEDURES AT THIS TIME  PATIENT HAS OXYGEN SATS IN THE 70'S BUT DID NOT HAVE ANY SIGNS OF CYANOSIS OR BREATHING DIFFICULTY    Past Medical History  Diagnosis Date  . Chronic cough 02/2011    CXR with increased interstitial markings [facility] pulmonology  . Psoriasis     elbows  . Hypertension   . Arthritis   . Pelvis  fracture ([place]) 2012  . History of dizziness   . Valvular heart disease   . Cervical disc disease   . Hx of appendectomy   . Hyperlipidemia   . Cancer ([place])     skin  . Pulmonary fibrosis ([place])     on 4L home oxygen     Review of Systems  Constitutional: Negative for fever, chills and fatigue.  HENT: Negative for postnasal drip, rhinorrhea and sinus pressure.   Respiratory: Negative for cough, shortness of breath and wheezing.   Cardiovascular: Negative for chest pain, palpitations and leg swelling.       Objective:   Physical Exam Filed Vitals:   08/27/15 1017  BP: 132/74  Pulse: 97  Height: 5' (1.524 m)  Weight: 143 lb (64.864 kg)  SpO2: 64%     Gen: well appearing, no acute distress HEENT: NCAT, EOMi, OP clear, neck supple without masses PULM:FINE Crackles in bases bilaterally, no wheezing CV: RRR, systolic murmur noted, no JVD AB: BS+, soft, nontender,  Ext: warm, no edema, no clubbing, no cyanosis Derm: no rash or skin breakdown Neuro: A&Ox4,    May 2013 CT chest reviewed> no pulmonary embolism, there is significant groundglass interstitial changes with some bronchiectasis in the bases. There is no clear honeycombing. The groundglass and interstitial thickening does not seem to be localized to the periphery. The interstitial changes are more significant in the bases of the lungs  than in the upper lobes. December 2015 CT angiogram chest [facility]> no pulmonary embolism, there is peripheral interlobular septal thickening and significant bronchiectasis, worse in the bases. There is no clear honeycombing. There is also increased mediastinal lymphadenopathy    Assessment & Plan:   82 yo pleasant white female with clinical signs and symptoms of END STAGE PULMONARY FIBROSIS with Chronic Oxygen Therapy Patient with very poor prognosis, with chronic resp failure, patient also with interrmittant reactive airways disease likely related to acute on chronic  bronchitis   Postinflammatory pulmonary fibrosis Plan: -start Pulmicort and Brovana Neb treatments -continue oxygen as needed -no need for steroids at this time   Discussion of EOL was explained to patient and family. She is now DNR status.   The Patient requires high complexity decision making for assessment and support, frequent evaluation and titration of therapies. Patient and family are satisfied with Plan of action and management   Pope Brunty Patricia Pesa, M.D.  [facility]  Medical Director [place] Director [facility] Cardio-Pulmonary Department

## 2015-08-27 NOTE — Telephone Encounter (Signed)
Pt son calling wanting to get an update on patient visit today Also states that Dr Mortimer Fries had some questions and he wanted to know if we could call him back and he can answer those questions

## 2015-08-30 ENCOUNTER — Telehealth: Payer: Self-pay | Admitting: Internal Medicine

## 2015-08-30 MED ORDER — ARFORMOTEROL TARTRATE 15 MCG/2ML IN NEBU: 15.0000 ug | INHALATION_SOLUTION | Freq: Two times a day (BID) | RESPIRATORY_TRACT | Status: DC

## 2015-08-30 NOTE — Telephone Encounter (Signed)
Pharmacy calling stating that we sent in a refill on medication and it says 4 times a day and they can only give it 2 times a day  Would need [place] to call back to verify that [facility] is the medication in questions And there fax number is [phone] We can fax the correct order to that

## 2015-08-30 NOTE — Telephone Encounter (Signed)
Per DS Garlon Hatchet needs to be BID. Order printed and signed by DS. Faxed to pharmacy. Nothing further needed.

## 2015-08-30 NOTE — Telephone Encounter (Signed)
Son's home # is [phone].

## 2015-08-30 NOTE — Telephone Encounter (Signed)
Spoke with son and wife stating that they can go through [place] to get some help during the day for his mother through her Medicare. Order can be placed as below:   on mrs Ralls-place a home health referral (like dme) to [facility]:  Nursing Assessment for anything patient may qualify for.    Son states that in April they may have [place] to place an order for above b/c they will need someone there to help. Son states he will call back if this is needed. Nothing further at this time.

## 2015-09-23 ENCOUNTER — Ambulatory Visit: Payer: Medicare HMO | Admitting: Internal Medicine

## 2015-09-24 ENCOUNTER — Inpatient Hospital Stay: Payer: Medicare HMO | Admitting: Internal Medicine

## 2015-09-24 ENCOUNTER — Ambulatory Visit: Payer: Medicare HMO | Admitting: Internal Medicine

## 2015-10-04 ENCOUNTER — Inpatient Hospital Stay
Admission: AD | Admit: 2015-10-04 | Discharge: 2015-10-05 | DRG: 202 | Disposition: A | Discharge status: Home / Self Care | Payer: Medicare HMO | Source: Ambulatory Visit | Attending: Specialist | Admitting: Specialist

## 2015-10-04 ENCOUNTER — Inpatient Hospital Stay: Payer: Medicare HMO

## 2015-10-04 DIAGNOSIS — M810 Age-related osteoporosis without current pathological fracture: Secondary | ICD-10-CM | POA: Diagnosis present

## 2015-10-04 DIAGNOSIS — L409 Psoriasis, unspecified: Secondary | ICD-10-CM | POA: Diagnosis present

## 2015-10-04 DIAGNOSIS — R0600 Dyspnea, unspecified: Secondary | ICD-10-CM

## 2015-10-04 DIAGNOSIS — Z79899 Other long term (current) drug therapy: Secondary | ICD-10-CM

## 2015-10-04 DIAGNOSIS — Z7951 Long term (current) use of inhaled steroids: Secondary | ICD-10-CM

## 2015-10-04 DIAGNOSIS — I482 Chronic atrial fibrillation: Secondary | ICD-10-CM | POA: Diagnosis present

## 2015-10-04 DIAGNOSIS — J9621 Acute and chronic respiratory failure with hypoxia: Secondary | ICD-10-CM | POA: Diagnosis not present

## 2015-10-04 DIAGNOSIS — M1990 Unspecified osteoarthritis, unspecified site: Secondary | ICD-10-CM | POA: Diagnosis present

## 2015-10-04 DIAGNOSIS — Z66 Do not resuscitate: Secondary | ICD-10-CM | POA: Diagnosis present

## 2015-10-04 DIAGNOSIS — Z8249 Family history of ischemic heart disease and other diseases of the circulatory system: Secondary | ICD-10-CM | POA: Diagnosis not present

## 2015-10-04 DIAGNOSIS — Z85828 Personal history of other malignant neoplasm of skin: Secondary | ICD-10-CM | POA: Diagnosis not present

## 2015-10-04 DIAGNOSIS — Z9981 Dependence on supplemental oxygen: Secondary | ICD-10-CM

## 2015-10-04 DIAGNOSIS — H409 Unspecified glaucoma: Secondary | ICD-10-CM | POA: Diagnosis present

## 2015-10-04 DIAGNOSIS — J8410 Pulmonary fibrosis, unspecified: Secondary | ICD-10-CM

## 2015-10-04 DIAGNOSIS — E039 Hypothyroidism, unspecified: Secondary | ICD-10-CM | POA: Diagnosis present

## 2015-10-04 DIAGNOSIS — Z888 Allergy status to other drugs, medicaments and biological substances status: Secondary | ICD-10-CM

## 2015-10-04 DIAGNOSIS — Z7983 Long term (current) use of bisphosphonates: Secondary | ICD-10-CM | POA: Diagnosis not present

## 2015-10-04 DIAGNOSIS — J209 Acute bronchitis, unspecified: Secondary | ICD-10-CM | POA: Diagnosis present

## 2015-10-04 DIAGNOSIS — Z7901 Long term (current) use of anticoagulants: Secondary | ICD-10-CM | POA: Diagnosis not present

## 2015-10-04 DIAGNOSIS — I110 Hypertensive heart disease with heart failure: Secondary | ICD-10-CM | POA: Diagnosis present

## 2015-10-04 DIAGNOSIS — I5033 Acute on chronic diastolic (congestive) heart failure: Secondary | ICD-10-CM | POA: Diagnosis present

## 2015-10-04 DIAGNOSIS — R0602 Shortness of breath: Secondary | ICD-10-CM

## 2015-10-04 LAB — COMPREHENSIVE METABOLIC PANEL
ALT: 32 U/L (ref 14–54)
ANION GAP: 9 (ref 5–15)
AST: 36 U/L (ref 15–41)
Albumin: 4 g/dL (ref 3.5–5.0)
Alkaline Phosphatase: 80 U/L (ref 38–126)
BILIRUBIN TOTAL: 0.5 mg/dL (ref 0.3–1.2)
BUN: 19 mg/dL (ref 6–20)
CALCIUM: 8.8 mg/dL — AB (ref 8.9–10.3)
CHLORIDE: 99 mmol/L — AB (ref 101–111)
CO2: 33 mmol/L — ABNORMAL HIGH (ref 22–32)
Creatinine, Ser: 0.97 mg/dL (ref 0.44–1.00)
GFR, EST AFRICAN AMERICAN: 58 mL/min — AB (ref >60)
GFR, EST NON AFRICAN AMERICAN: 50 mL/min — AB (ref >60)
Glucose, Bld: 120 mg/dL — ABNORMAL HIGH (ref 65–99)
POTASSIUM: 3.5 mmol/L (ref 3.5–5.1)
Sodium: 141 mmol/L (ref 135–145)
Total Protein: 6.8 g/dL (ref 6.5–8.1)

## 2015-10-04 LAB — CBC
HCT: 40.4 % (ref 35.0–47.0)
Hemoglobin: 13.4 g/dL (ref 12.0–16.0)
MCH: 30.5 pg (ref 26.0–34.0)
MCHC: 33.1 g/dL (ref 32.0–36.0)
MCV: 92.3 fL (ref 80.0–100.0)
Platelets: 156 10*3/uL (ref 150–440)
RBC: 4.38 MIL/uL (ref 3.80–5.20)
RDW: 16.1 % — AB (ref 11.5–14.5)
WBC: 5.9 10*3/uL (ref 3.6–11.0)

## 2015-10-04 LAB — INFLUENZA PANEL BY PCR (TYPE A & B)
H1N1 flu by pcr: NOT DETECTED
Influenza A By PCR: NEGATIVE
Influenza B By PCR: NEGATIVE

## 2015-10-04 LAB — MAGNESIUM: MAGNESIUM: 1.6 mg/dL — AB (ref 1.7–2.4)

## 2015-10-04 MED ORDER — LISINOPRIL 5 MG PO TABS
2.5000 mg | ORAL_TABLET | Freq: Every day | ORAL | Status: DC
Start: 2015-10-04 — End: 2015-10-04

## 2015-10-04 MED ORDER — DILTIAZEM HCL ER COATED BEADS 180 MG PO CP24
180.0000 mg | ORAL_CAPSULE | Freq: Every day | ORAL | Status: DC
  Administered 2015-10-05: 180 mg via ORAL
  Filled 2015-10-04 (×2): qty 1

## 2015-10-04 MED ORDER — ZOLPIDEM TARTRATE 5 MG PO TABS
5.0000 mg | ORAL_TABLET | Freq: Every day | ORAL | Status: DC
  Administered 2015-10-04: 5 mg via ORAL
  Filled 2015-10-04: qty 1

## 2015-10-04 MED ORDER — ONDANSETRON HCL 4 MG/2ML IJ SOLN: 4.0000 mg | Freq: Four times a day (QID) | INTRAMUSCULAR | Status: DC | PRN

## 2015-10-04 MED ORDER — APIXABAN 5 MG PO TABS
5.0000 mg | ORAL_TABLET | Freq: Two times a day (BID) | ORAL | Status: DC
  Administered 2015-10-04 – 2015-10-05 (×2): 5 mg via ORAL
  Filled 2015-10-04 (×3): qty 1

## 2015-10-04 MED ORDER — BUDESONIDE 0.5 MG/2ML IN SUSP
0.5000 mg | Freq: Two times a day (BID) | RESPIRATORY_TRACT | Status: DC
  Administered 2015-10-04 – 2015-10-05 (×4): 0.5 mg via RESPIRATORY_TRACT
  Filled 2015-10-04 (×3): qty 2

## 2015-10-04 MED ORDER — BRIMONIDINE TARTRATE 0.2 % OP SOLN
1.0000 [drp] | Freq: Two times a day (BID) | OPHTHALMIC | Status: DC
  Administered 2015-10-04 – 2015-10-05 (×2): 1 [drp] via OPHTHALMIC
  Filled 2015-10-04: qty 5

## 2015-10-04 MED ORDER — LEVOTHYROXINE SODIUM 50 MCG PO TABS
50.0000 ug | ORAL_TABLET | Freq: Every day | ORAL | Status: DC
  Administered 2015-10-05: 50 ug via ORAL
  Filled 2015-10-04: qty 1

## 2015-10-04 MED ORDER — ARFORMOTEROL TARTRATE 15 MCG/2ML IN NEBU
15.0000 ug | INHALATION_SOLUTION | Freq: Two times a day (BID) | RESPIRATORY_TRACT | Status: DC
  Administered 2015-10-04 – 2015-10-05 (×2): 15 ug via RESPIRATORY_TRACT
  Filled 2015-10-04 (×4): qty 2

## 2015-10-04 MED ORDER — IRBESARTAN 150 MG PO TABS
300.0000 mg | ORAL_TABLET | Freq: Every day | ORAL | Status: DC
  Administered 2015-10-05: 300 mg via ORAL
  Filled 2015-10-04 (×2): qty 2

## 2015-10-04 MED ORDER — POTASSIUM CHLORIDE CRYS ER 10 MEQ PO TBCR
10.0000 meq | EXTENDED_RELEASE_TABLET | Freq: Every day | ORAL | Status: DC
  Administered 2015-10-05: 10 meq via ORAL
  Filled 2015-10-04 (×2): qty 1

## 2015-10-04 MED ORDER — ONDANSETRON HCL 4 MG PO TABS: 4.0000 mg | ORAL_TABLET | Freq: Four times a day (QID) | ORAL | Status: DC | PRN

## 2015-10-04 MED ORDER — DOXYCYCLINE HYCLATE 100 MG PO TABS
100.0000 mg | ORAL_TABLET | Freq: Two times a day (BID) | ORAL | Status: DC
  Administered 2015-10-04 – 2015-10-05 (×2): 100 mg via ORAL
  Filled 2015-10-04 (×3): qty 1

## 2015-10-04 MED ORDER — SODIUM CHLORIDE 0.9 % IV SOLN: 250.0000 mL | INTRAVENOUS | Status: DC | PRN

## 2015-10-04 MED ORDER — METHYLPREDNISOLONE SODIUM SUCC 125 MG IJ SOLR
60.0000 mg | Freq: Three times a day (TID) | INTRAMUSCULAR | Status: DC
  Administered 2015-10-04 – 2015-10-05 (×2): 60 mg via INTRAVENOUS
  Filled 2015-10-04 (×2): qty 2

## 2015-10-04 MED ORDER — SODIUM CHLORIDE 0.9% FLUSH: 3.0000 mL | INTRAVENOUS | Status: DC | PRN

## 2015-10-04 MED ORDER — FUROSEMIDE 10 MG/ML IJ SOLN
40.0000 mg | Freq: Two times a day (BID) | INTRAMUSCULAR | Status: DC
  Administered 2015-10-05: 40 mg via INTRAVENOUS
  Filled 2015-10-04: qty 4

## 2015-10-04 MED ORDER — ACETAMINOPHEN 650 MG RE SUPP: 650.0000 mg | Freq: Four times a day (QID) | RECTAL | Status: DC | PRN

## 2015-10-04 MED ORDER — BRIMONIDINE TARTRATE-TIMOLOL 0.2-0.5 % OP SOLN: 1.0000 [drp] | Freq: Two times a day (BID) | OPHTHALMIC | Status: DC

## 2015-10-04 MED ORDER — SODIUM CHLORIDE 0.9% FLUSH
3.0000 mL | Freq: Two times a day (BID) | INTRAVENOUS | Status: DC
  Administered 2015-10-04: 3 mL via INTRAVENOUS

## 2015-10-04 MED ORDER — TIMOLOL MALEATE 0.5 % OP SOLN
1.0000 [drp] | Freq: Two times a day (BID) | OPHTHALMIC | Status: DC
  Administered 2015-10-04 – 2015-10-05 (×2): 1 [drp] via OPHTHALMIC
  Filled 2015-10-04: qty 5

## 2015-10-04 MED ORDER — ACETAMINOPHEN 325 MG PO TABS
650.0000 mg | ORAL_TABLET | Freq: Four times a day (QID) | ORAL | Status: DC | PRN
  Administered 2015-10-05: 650 mg via ORAL
  Filled 2015-10-04: qty 2

## 2015-10-04 MED ORDER — ALBUTEROL SULFATE (2.5 MG/3ML) 0.083% IN NEBU
2.5000 mg | INHALATION_SOLUTION | RESPIRATORY_TRACT | Status: DC | PRN
  Administered 2015-10-04: 2.5 mg via RESPIRATORY_TRACT
  Filled 2015-10-04: qty 3

## 2015-10-04 NOTE — Care Management Important Message (Deleted)
Important Message  Patient Details  Name: [name] MRN: [id number] Date of Birth: 06-04-1924   Medicare Important Message Given:  Yes    Marshell Garfinkel, RN 10/04/2015, 2:02 PM

## 2015-10-04 NOTE — Consult Note (Signed)
[name]      Assessment and Plan:  Acute respiratory failure due to acute bronchitis. -We will check influenza panel. -Treat empirically with steroids, antibiotics. -We'll check a chest x-ray, the pneumonia will be difficult to rule out given presence of diffuse interstitial lung disease.  Chronic respiratory failure, severe, secondary to pulmonary fibrosis. -Chronic progressive hypoxia. -Patient often chronically has oxygen saturation in the 70s to 80s. We'll need to tolerate a lower oxygen saturation in this patient.  DO NOT RESUSCITATE, patient confirms the presence of her son that she would not want to be placed on life support or want any heroic measures performed.  Date: 10/04/2015  MRN# [id number] [name] [birth year]  Referring Physician: Dr. Roseanne Kaufman [name] is a 82 y.o. old female seen in [place] for chief complaint of:   Dyspnea.   HPI:   The patient is a 82 year old female who sees my colleague, Dr. Mortimer Fries as her pulmonologist in the office. Review of her chart shows that she carries a diagnosis of pulmonary fibrosis. Discussion with her pulmonologist, she has opted not to pursue any diagnostic testing for her pulmonary fibrosis nor has she wanted to be treated aggressively. The patient's son is also at bedside and gives some of the history. The patient has chronic respiratory failure, and can ambulate minimally around her home, she has poor functional status and her respiratory and functional status, have declined over the years. She says her oxygen saturation is often in the 70s to 80s range while wearing 4 L of oxygen. However, the patient is noted over the last week, she has had a lot of chest congestion, with cough which is been nonproductive. She says coinciding with this. She has felt more short of breath. Her son notes that she is occasionally turned blue on several occasions and her lips, and with a checked her oxygen  saturations on 2 occasions. On one time it was as low as 40%. Subsequently she presented to her primary care physician who directly admitted her to the hospital.   PMHX:   Past Medical History  Diagnosis Date  . Chronic cough 02/2011    CXR with increased interstitial markings [facility] pulmonology  . Psoriasis     elbows  . Hypertension   . Arthritis   . Pelvis fracture ([place]) 2012  . History of dizziness   . Valvular heart disease   . Cervical disc disease   . Hx of appendectomy   . Hyperlipidemia   . Cancer ([place])     skin  . Pulmonary fibrosis ([facility])     on 4L home oxygen   Surgical Hx:  Past Surgical History  Procedure Laterality Date  . Back surgery  2007  . Total abdominal hysterectomy w/ bilateral salpingoophorectomy    . Skin cancer excision    . Cardiac catheterization  2006  . Cataract surgery     Family Hx:  Family History  Problem Relation Age of Onset  . CAD Father   . CAD Mother    Social Hx:   Social History  Substance Use Topics  . Smoking status: Never Smoker   . Smokeless tobacco: Never Used     Comment: exposed to second hand smoke  . Alcohol Use: 0.0 oz/week    0 Standard drinks or equivalent per week     Comment: occasional wine   Medication:   No current outpatient prescriptions on file.    Allergies:  Lipitor and Simvastatin  Review of Systems: Gen:  Denies  fever, sweats, chills HEENT: Denies blurred vision, double vision. bleeds, sore throat Cvc:  No dizziness, chest pain. Resp:   Denies cough or sputum production, shortness of breath Gi: Denies swallowing difficulty, stomach pain. Gu:  Denies bladder incontinence, burning urine Ext:   No Joint pain, stiffness. Skin: No skin rash,  hives  Endoc:  No polyuria, polydipsia. Psych: No depression, insomnia. Other:  All other systems were reviewed with the patient and were negative other that what is mentioned in the HPI.   Physical Examination:   VS: BP 139/75 mmHg  Pulse 70   Temp(Src) 97.8 F (36.6 C) (Oral)  Resp 20  SpO2 89%  General Appearance: No distress  Neuro:without focal findings,  speech normal,  HEENT: PERRLA, EOM intact.   Pulmonary: Diffuse bilateral crackles.  CardiovascularNormal S1,S2.  No m/r/g.   Abdomen: Benign, Soft, non-tender. Renal:  No costovertebral tenderness  GU:  No performed at this time. Endoc: No evident thyromegaly, no signs of acromegaly. Skin:   warm, no rashes, no ecchymosis  Extremities: normal, no cyanosis, clubbing.  Other findings:    LABORATORY PANEL:   CBC No results for input(s): WBC, HGB, HCT, PLT in the last 168 hours. ------------------------------------------------------------------------------------------------------------------  Chemistries  No results for input(s): NA, K, CL, CO2, GLUCOSE, BUN, CREATININE, CALCIUM, MG, AST, ALT, ALKPHOS, BILITOT in the last 168 hours.  Invalid input(s): GFRCGP ------------------------------------------------------------------------------------------------------------------  Cardiac Enzymes No results for input(s): TROPONINI in the last 168 hours. ------------------------------------------------------------  RADIOLOGY:  No results found.     Thank  you for the [place] and for allowing [facility] to assist in the care of your patient. Our recommendations are noted above.  Please contact us if we can be of further service.   Marda Stalker, MD.  Board Certified in Internal Medicine, Pulmonary Medicine, [place], and Sleep Medicine.  [facility] Office Number: [phone]  Patricia Pesa, M.D.  Vilinda Boehringer, M.D.  Merton Border, M.D  10/04/2015

## 2015-10-04 NOTE — H&P (Addendum)
[facility] [place] NAME: [name]    MR#:  [id number]  DATE OF BIRTH:  [birth year]  DATE OF ADMISSION:  10/04/2015  PRIMARY CARE PHYSICIAN: Tama High III, MD   REQUESTING/REFERRING PHYSICIAN: Adin Hector, MD   CHIEF COMPLAINT:  No chief complaint on file. Decrease O2 saturation to 40 to 60s% today.  HISTORY OF PRESENT ILLNESS:  [name]  is a 82 y.o. female with a known history of Chronic respiratory failure on home oxygen, pulmonary fibrosis, hypertension and the valvular heart disease. The patient was sent by primary care physician Dr. Caryl Comes from his office due to decreased oxygen level at 40-60%. The patient has chronic respiratory failure with pulmonary fibrosis on home oxygen 4 L. She has had worsening shortness breath on exertion for the past few days. She also complains of worsening ankle swelling while she is on lasix. But she denies any fever, chills, cough or sputum or wheezing.  PAST MEDICAL HISTORY:   Past Medical History  Diagnosis Date  . Chronic cough 02/2011    CXR with increased interstitial markings [facility] pulmonology  . Psoriasis     elbows  . Hypertension   . Arthritis   . Pelvis fracture ([place]) 2012  . History of dizziness   . Valvular heart disease   . Cervical disc disease   . Hx of appendectomy   . Hyperlipidemia   . Cancer ([place])     skin  . Pulmonary fibrosis ([place])     on 4L home oxygen    PAST SURGICAL HISTORY:   Past Surgical History  Procedure Laterality Date  . Back surgery  2007  . Total abdominal hysterectomy w/ bilateral salpingoophorectomy    . Skin cancer excision    . Cardiac catheterization  2006  . Cataract surgery      SOCIAL HISTORY:   Social History  Substance Use Topics  . Smoking status: Never Smoker   . Smokeless tobacco: Never Used     Comment: exposed to second hand smoke  . Alcohol Use: 0.0 oz/week    0 Standard drinks or equivalent per week     Comment:  occasional wine    FAMILY HISTORY:   Family History  Problem Relation Age of Onset  . CAD Father   . CAD Mother     DRUG ALLERGIES:   Allergies  Allergen Reactions  . Lipitor [Atorvastatin] Nausea And Vomiting  . Simvastatin Nausea And Vomiting    REVIEW OF SYSTEMS:  CONSTITUTIONAL: No fever, fatigue or weakness.  EYES: No blurred or double vision.  EARS, NOSE, AND THROAT: No tinnitus or ear pain.  RESPIRATORY: Has cough, shortness of breath, no wheezing or hemoptysis.  CARDIOVASCULAR: No chest pain, orthopnea, But has bilateral ankle edema.  GASTROINTESTINAL: No nausea, vomiting, diarrhea or abdominal pain.  GENITOURINARY: No dysuria, hematuria.  ENDOCRINE: No polyuria, nocturia,  HEMATOLOGY: No anemia, easy bruising or bleeding SKIN: No rash or lesion. MUSCULOSKELETAL: No joint pain or arthritis.   NEUROLOGIC: No tingling, numbness, weakness.  PSYCHIATRY: No anxiety or depression.   MEDICATIONS AT HOME:   Prior to Admission medications   Medication Sig Start Date End Date Taking? Authorizing Provider  alendronate (FOSAMAX) 70 MG tablet Take 70 mg by mouth once a week. Take on Friday. 07/07/14   Historical Provider, MD  amoxicillin-clavulanate (AUGMENTIN) 875-125 MG tablet Take 1 tablet by mouth 2 (two) times daily. 08/19/15   Delman Kitten, MD  apixaban (ELIQUIS) 5 MG TABS tablet Take 1 tablet (5 mg total) by mouth 2 (two) times daily. 08/11/15   Demetrios Loll, MD  arformoterol (BROVANA) 15 MCG/2ML NEBU Take 2 mLs (15 mcg total) by nebulization 4 (four) times daily - after meals and at bedtime. 08/27/15   Flora Lipps, MD  arformoterol (BROVANA) 15 MCG/2ML NEBU Take 2 mLs (15 mcg total) by nebulization 2 (two) times daily. 08/30/15   Wilhelmina Mcardle, MD  azelastine (ASTELIN) 0.1 % nasal spray Place 1 spray into both nostrils 2 (two) times daily as needed. For congestion. 05/12/15   Historical Provider, MD  bimatoprost (LUMIGAN) 0.03 % ophthalmic solution Place 1 drop into both eyes 2  (two) times daily.     Historical Provider, MD  budesonide (PULMICORT) 0.5 MG/2ML nebulizer solution Take 2 mLs (0.5 mg total) by nebulization 2 (two) times daily. 08/27/15   Flora Lipps, MD  budesonide (PULMICORT) 0.5 MG/2ML nebulizer solution Take 2 mLs (0.5 mg total) by nebulization 2 (two) times daily. 08/27/15   Flora Lipps, MD  cephALEXin (KEFLEX) 500 MG capsule Take 1 capsule (500 mg total) by mouth 2 (two) times daily. 08/22/15   Carrie Mew, MD  clobetasol cream (TEMOVATE) [id number] % Apply 1 application topically 2 (two) times daily as needed. For psoriasis. 05/12/15   Historical Provider, MD  COMBIGAN 0.2-0.5 % ophthalmic solution Place 1 drop into both eyes 2 (two) times daily. 06/22/14   Historical Provider, MD  diltiazem (CARDIZEM CD) 180 MG 24 hr capsule Take 180 mg by mouth daily. 06/27/15   Historical Provider, MD  furosemide (LASIX) 40 MG tablet Take 1 tablet by mouth daily. 08/16/15   Historical Provider, MD  levothyroxine (SYNTHROID, LEVOTHROID) 50 MCG tablet Take 50 mcg by mouth daily before breakfast.    Historical Provider, MD  olmesartan (BENICAR) 40 MG tablet Take 40 mg by mouth daily.    Historical Provider, MD  OXYGEN Inhale 4 L into the lungs continuous.    Historical Provider, MD  potassium chloride (MICRO-K) 10 MEQ CR capsule Take 10 mEq by mouth every other day. 06/26/14   Historical Provider, MD  zolpidem (AMBIEN) 5 MG tablet Take 1 tablet (5 mg total) by mouth at bedtime. 08/20/15   Laverle Hobby, MD      VITAL SIGNS:  Blood pressure 139/75, pulse 70, temperature 97.8 F (36.6 C), temperature source Oral, resp. rate 20, SpO2 89 %.  PHYSICAL EXAMINATION:  GENERAL:  82 y.o.-year-old patient lying in the bed with no acute distress. On oxygen 4 L by nasal cannula. EYES: Pupils equal, round, reactive to light and accommodation. No scleral icterus. Extraocular muscles intact.  HEENT: Head atraumatic, normocephalic. Oropharynx and nasopharynx clear.  NECK:  Supple,  no jugular venous distention. No thyroid enlargement, no tenderness.  LUNGS: Normal breath sounds bilaterally, no wheezing, but has bilateral crackles. No use of accessory muscles of respiration.  CARDIOVASCULAR: S1, S2 normal. No murmurs, rubs, or gallops.  ABDOMEN: Soft, nontender, nondistended. Bowel sounds present. No organomegaly or mass.  EXTREMITIES: The lateral ankle edema 1+, trace leg edema, no cyanosis, or clubbing.  NEUROLOGIC: Cranial nerves II through XII are intact. Muscle strength 5/5 in all extremities. Sensation intact. Gait not checked.  PSYCHIATRIC: The patient is alert and oriented x 3.  SKIN: No obvious rash, lesion, or ulcer.   LABORATORY PANEL:   CBC No results for input(s): WBC, HGB, HCT, PLT in the last 168 hours. ------------------------------------------------------------------------------------------------------------------  Chemistries  No results for input(s): NA, K,  CL, CO2, GLUCOSE, BUN, CREATININE, CALCIUM, MG, AST, ALT, ALKPHOS, BILITOT in the last 168 hours.  Invalid input(s): GFRCGP ------------------------------------------------------------------------------------------------------------------  Cardiac Enzymes No results for input(s): TROPONINI in the last 168 hours. ------------------------------------------------------------------------------------------------------------------  RADIOLOGY:  No results found.  EKG:   Orders placed or performed during the hospital encounter of 08/22/15  . EKG 12-Lead  . EKG 12-Lead  . ED EKG  . ED EKG    IMPRESSION AND PLAN:    Acute on chronic respiratory failure, pulmonary fibrosis. Continue oxygen by nasal cannula 4 L at this time, may need high flow if necessary. Start IV Solu-Medrol and doxycycline per Dr. Caryl Comes. Continue DuoNeb when necessary,  Pulmicort and other nebulizer medication. Chest x-ray and Pulmicort consult. Check CBC and BMP.  Worsening lower extremity edema.Possible acute on  chronic diastolic CHF.  I will increase Lasix to 40 mg IV twice a day for now and get echocardiograph. Monitor BMP and adjust the Lasix dose accordingly.  Hypertension. Controlled. Continue hypertension medication.  All the records are reviewed and case discussed with ED provider. Management plans discussed with the patient, Her son and they are in agreement.  CODE STATUS: DO NOT RESUSCITATE  TOTAL TIME TAKING CARE OF THIS PATIENT: 55 minutes.    Demetrios Loll M.D on 10/04/2015 at 2:45 PM  Between 7am to 6pm - Pager - [phone]  After 6pm go to [email or web address] - password [email or web address] [facility]  [facility]  Office  [phone]  CC: Primary care physician; Adin Hector, MD

## 2015-10-04 NOTE — Progress Notes (Signed)
Per pt, O2 sat is normally in 85-90% range on 4 L O2.

## 2015-10-05 ENCOUNTER — Inpatient Hospital Stay
Admission: AD | Admit: 2015-10-05 | Discharge: 2015-10-05 | Disposition: A | Discharge status: Home / Self Care | Payer: Medicare HMO | Source: Ambulatory Visit | Attending: Internal Medicine | Admitting: Internal Medicine

## 2015-10-05 ENCOUNTER — Telehealth: Payer: Self-pay

## 2015-10-05 LAB — BASIC METABOLIC PANEL
Anion gap: 9 (ref 5–15)
BUN: 22 mg/dL — AB (ref 6–20)
CO2: 33 mmol/L — ABNORMAL HIGH (ref 22–32)
CREATININE: 0.84 mg/dL (ref 0.44–1.00)
Calcium: 8.5 mg/dL — ABNORMAL LOW (ref 8.9–10.3)
Chloride: 98 mmol/L — ABNORMAL LOW (ref 101–111)
GFR calc Af Amer: >60 mL/min (ref >60)
GFR, EST NON AFRICAN AMERICAN: 59 mL/min — AB (ref >60)
GLUCOSE: 154 mg/dL — AB (ref 65–99)
POTASSIUM: 3.9 mmol/L (ref 3.5–5.1)
Sodium: 140 mmol/L (ref 135–145)

## 2015-10-05 LAB — ECHOCARDIOGRAM COMPLETE
Height: 58 in
WEIGHTICAEL: 2156.8 [oz_av]

## 2015-10-05 LAB — CBC
HEMATOCRIT: 41.9 % (ref 35.0–47.0)
Hemoglobin: 13.5 g/dL (ref 12.0–16.0)
MCH: 30.9 pg (ref 26.0–34.0)
MCHC: 32.2 g/dL (ref 32.0–36.0)
MCV: 96 fL (ref 80.0–100.0)
PLATELETS: 152 10*3/uL (ref 150–440)
RBC: 4.36 MIL/uL (ref 3.80–5.20)
RDW: 15.9 % — AB (ref 11.5–14.5)
WBC: 5.2 10*3/uL (ref 3.6–11.0)

## 2015-10-05 MED ORDER — PREDNISONE 10 MG PO TABS: ORAL_TABLET | ORAL | Status: DC

## 2015-10-05 MED ORDER — DOXYCYCLINE HYCLATE 100 MG PO TABS: 100.0000 mg | ORAL_TABLET | Freq: Two times a day (BID) | ORAL | Status: DC

## 2015-10-05 NOTE — Progress Notes (Signed)
Patient's home medication verified with [facility] pharmacy [phone].

## 2015-10-05 NOTE — Progress Notes (Signed)
*  PRELIMINARY RESULTS* Echocardiogram 2D Echocardiogram has been performed.  [name] 10/05/2015, 8:01 AM

## 2015-10-05 NOTE — Progress Notes (Signed)
Grand daughter at the bedside- patient discharge via wheelchair. Oxygen at  4L per [place] at discharge.

## 2015-10-05 NOTE — Progress Notes (Signed)
Patient going for echo per order via bed.

## 2015-10-05 NOTE — Progress Notes (Signed)
Order to discharge patient to home today, grand daughter at the bedside, discharge instructions given per MD order, home and new medications reviewed with patient and grand daughter. Patient on chronic oxygen 4L per [place].  [place] daughter and patient verbalized understanding of discharge instructions given.

## 2015-10-05 NOTE — Care Management Note (Signed)
Case Management Note  Patient Details  Name: [name] MRN: [id number] Date of Birth: 11/22/1923  Subjective/Objective:                   Met with patient to discuss discharge planning. She was somewhat SOB during conversation. She expresses her anger with "not being able to do for herself anymore". She does not look like she is 82 years old and I complimented her on that. She lives alone and family has taken away driving privileges. They drive her to appointments but she hates depending on them. She is on chronic O2 at home through [employer]. Her cardiac MD is Dr. Callwood. Her PCP is Dr. Bert Klein. She refused home health services- states she has had it before and it made her very uncomfortable. She denies difficulty paying for medications.  Action/Plan: I talked to her about online grocery shopping and having them delivered to her address. I talked to her about meditation or prayer to help her through this anger she describes. She was appreciative of CM visit but refused home health services. No further RNCM needs. Case closed.   Expected Discharge Date:                  Expected Discharge Plan:     In-House Referral:     Discharge planning Services  CM Consult  Post Acute Care Choice:    Choice offered to:  Patient  DME Arranged:    DME Agency:     HH Arranged:    HH Agency:     Status of Service:  Completed, signed off  Medicare Important Message Given:  Yes Date Medicare IM Given:    Medicare IM give by:    Date Additional Medicare IM Given:    Additional Medicare Important Message give by:     If discussed at Long Length of Stay Meetings, dates discussed:    Additional Comments:   , RN 10/05/2015, 11:57 AM  

## 2015-10-05 NOTE — Telephone Encounter (Signed)
Patient called in error meant to call [facility]

## 2015-10-06 NOTE — Discharge Summary (Signed)
[facility] NAME: Taviona Tribe    MR#:  [id number]  DATE OF BIRTH:  [birth year]  DATE OF ADMISSION:  10/04/2015 ADMITTING PHYSICIAN: Dustin Flock, MD  DATE OF DISCHARGE: 10/05/2015  4:47 PM  PRIMARY CARE PHYSICIAN: Tama High III, MD    ADMISSION DIAGNOSIS:  Acute on Chronic Respiratory Failure   DISCHARGE DIAGNOSIS:  Principal Problem:   Acute on chronic respiratory failure with hypoxia ([place])   SECONDARY DIAGNOSIS:   Past Medical History  Diagnosis Date  . Chronic cough 02/2011    CXR with increased interstitial markings [facility] pulmonology  . Psoriasis     elbows  . Hypertension   . Arthritis   . Pelvis fracture ([place]) 2012  . History of dizziness   . Valvular heart disease   . Cervical disc disease   . Hx of appendectomy   . Hyperlipidemia   . Cancer ([place])     skin  . Pulmonary fibrosis (HCC)     on 4L home oxygen    HOSPITAL COURSE:   82 year old female with past medical history of pulmonary fibrosis, hyperlipidemia, osteoarthritis, hypertension, psoriasis, hypothyroidism, osteoporosis, glaucoma, chronic atrial fibrillation who presented to the hospital due to shortness of breath and in acute respiratory failure with hypoxia.  #1 acute on chronic respiratory failure-this was secondary to pulmonary fibrosis with exacerbation. -Patient was treated with IV steroids, DuoNeb's as needed and also Pulmicort nebs and empiric doxycycline. -After aggressive therapy patient's shortness of breath and hypoxia is improved and she is now on her baseline 4 L oxygen feeling better and therefore being discharged home.  #2 pulmonary fibrosis flare-this was the cause of patient's acute on chronic respiratory failure with hypoxia -Patient was treated with aggressive IV steroids, DuoNeb's, print Pulmicort nebs. Patient was also given empiric doxycycline. -A pulmonary consult was obtained who agreed with this management. Patient  turned around fairly quickly and felt better the day after admission and therefore is being discharged on a long prednisone taper, doxycycline and follow up with her pulmonologist as an outpatient.  #3 glaucoma-patient will resume her Combigan and Lumigan eyedrops.  #4 history of chronic atrial fibrillation-patient remained rate controlled on the hospital. Will continue her Cardizem upon discharge. -She will continue her Eliquis for long-term anticoagulation. She did have echocardiogram given her mild lower extremity edema which showed normal ejection fraction.  #5 CHF-acute on chronic diastolic CHF. Patient did have some mild CHF and was given a couple doses of IV diuretics and has clinically improved and will resume her oral Lasix and Cardizem upon discharge.  #6 hypothyroidism - patient will resume her Synthroid.  #7 essential hypertension-continue Cardizem, Benicar   DISCHARGE CONDITIONS:   Stable  CONSULTS OBTAINED:  Treatment Team:  Laverle Hobby, MD  DRUG ALLERGIES:   Allergies  Allergen Reactions  . Lipitor [Atorvastatin] Nausea And Vomiting  . Simvastatin Nausea And Vomiting    DISCHARGE MEDICATIONS:   Discharge Medication List as of 10/05/2015  2:53 PM    START taking these medications   Details  doxycycline (VIBRA-TABS) 100 MG tablet Take 1 tablet (100 mg total) by mouth 2 (two) times daily., Starting 10/05/2015, Until Discontinued, Print    predniSONE (DELTASONE) 10 MG tablet Take 6 pills for 3 days, then take 4 pills for 3 days, then take 2 pills until you see your Pulmonologist., Print      CONTINUE these medications which have NOT CHANGED   Details  alendronate (FOSAMAX)  70 MG tablet Take 70 mg by mouth once a week. Reported on 10/05/2015, Starting 07/07/2014, Until Discontinued, Historical Med    apixaban (ELIQUIS) 5 MG TABS tablet Take 1 tablet (5 mg total) by mouth 2 (two) times daily., Starting 08/11/2015, Until Discontinued, Print    !!  arformoterol (BROVANA) 15 MCG/2ML NEBU Take 2 mLs (15 mcg total) by nebulization 4 (four) times daily - after meals and at bedtime., Starting 08/27/2015, Until Discontinued, Print    !! arformoterol (BROVANA) 15 MCG/2ML NEBU Take 2 mLs (15 mcg total) by nebulization 2 (two) times daily., Starting 08/30/2015, Until Discontinued, Print    azelastine (ASTELIN) 0.1 % nasal spray Place 1 spray into both nostrils 2 (two) times daily as needed. For congestion., Starting 05/12/2015, Until Discontinued, Historical Med    bimatoprost (LUMIGAN) 0.03 % ophthalmic solution Place 1 drop into both eyes 2 (two) times daily. , Until Discontinued, Historical Med    !! budesonide (PULMICORT) 0.5 MG/2ML nebulizer solution Take 2 mLs (0.5 mg total) by nebulization 2 (two) times daily., Starting 08/27/2015, Until Discontinued, Print    !! budesonide (PULMICORT) 0.5 MG/2ML nebulizer solution Take 2 mLs (0.5 mg total) by nebulization 2 (two) times daily., Starting 08/27/2015, Until Discontinued, Print    clobetasol cream (TEMOVATE) [id number] % Apply 1 application topically 2 (two) times daily as needed. For psoriasis., Starting 05/12/2015, Until Discontinued, Historical Med    COMBIGAN 0.2-0.5 % ophthalmic solution Place 1 drop into both eyes 2 (two) times daily., Starting 06/22/2014, Until Discontinued, Historical Med    diltiazem (CARDIZEM CD) 180 MG 24 hr capsule Take 180 mg by mouth daily., Starting 06/27/2015, Until Discontinued, Historical Med    furosemide (LASIX) 40 MG tablet Take 1 tablet by mouth daily., Starting 08/16/2015, Until Discontinued, Historical Med    levothyroxine (SYNTHROID, LEVOTHROID) 50 MCG tablet Take 50 mcg by mouth daily before breakfast., Until Discontinued, Historical Med    olmesartan (BENICAR) 40 MG tablet Take 40 mg by mouth daily., Until Discontinued, Historical Med    OXYGEN Inhale 4 L into the lungs continuous., Until Discontinued, Historical Med    potassium chloride (MICRO-K) 10 MEQ CR  capsule Take 10 mEq by mouth every other day., Starting 06/26/2014, Until Discontinued, Historical Med    zolpidem (AMBIEN) 5 MG tablet Take 1 tablet (5 mg total) by mouth at bedtime., Starting 08/20/2015, Until Discontinued, Print     !! - Potential duplicate medications found. Please discuss with provider.    STOP taking these medications     amoxicillin-clavulanate (AUGMENTIN) 875-125 MG tablet      cephALEXin (KEFLEX) 500 MG capsule          DISCHARGE INSTRUCTIONS:   DIET:  Cardiac diet  DISCHARGE CONDITION:  Stable  ACTIVITY:  Activity as tolerated  OXYGEN:  Home Oxygen: Yes.     Oxygen Delivery: 4 liters/min via Patient connected to nasal cannula oxygen  DISCHARGE LOCATION:  home   If you experience worsening of your admission symptoms, develop shortness of breath, life threatening emergency, suicidal or homicidal thoughts you must seek medical attention immediately by calling 911 or calling your MD immediately  if symptoms less severe.  You Must read complete instructions/literature along with all the possible adverse reactions/side effects for all the Medicines you take and that have been prescribed to you. Take any new Medicines after you have completely understood and accpet all the possible adverse reactions/side effects.   Please note  You were cared for by a hospitalist during your hospital  stay. If you have any questions about your discharge medications or the care you received while you were in the hospital after you are discharged, you can call the unit and asked to speak with the hospitalist on call if the hospitalist that took care of you is not available. Once you are discharged, your primary care physician will handle any further medical issues. Please note that NO REFILLS for any discharge medications will be authorized once you are discharged, as it is imperative that you return to your primary care physician (or establish a relationship with a primary  care physician if you do not have one) for your aftercare needs so that they can reassess your need for medications and monitor your lab values.     Today   Patient's shortness of breath has improved. No cough, fever, chills. Granddaughter at bedside. Patient wants to go home today.  VITAL SIGNS:  Blood pressure 114/78, pulse 71, temperature 98.5 F (36.9 C), temperature source Oral, resp. rate 18, height 4\' 10"  (1.473 m), weight 61.145 kg (134 lb 12.8 oz), SpO2 92 %.  I/O:  No intake or output data in the 24 hours ending 10/06/15 1535  PHYSICAL EXAMINATION:  GENERAL:  82 y.o.-year-old patient lying in the bed with no acute distress.  EYES: Pupils equal, round, reactive to light and accommodation. No scleral icterus. Extraocular muscles intact.  HEENT: Head atraumatic, normocephalic. Oropharynx and nasopharynx clear.  NECK:  Supple, no jugular venous distention. No thyroid enlargement, no tenderness.  LUNGS: Normal breath sounds bilaterally, no wheezing, Bibasilar dry rales, no rhonchi. No use of accessory muscles of respiration.  CARDIOVASCULAR: S1, S2 normal. No murmurs, rubs, or gallops.  ABDOMEN: Soft, non-tender, non-distended. Bowel sounds present. No organomegaly or mass.  EXTREMITIES: No pedal edema, cyanosis, or clubbing.  NEUROLOGIC: Cranial nerves II through XII are intact. No focal motor or sensory defecits b/l.  PSYCHIATRIC: The patient is alert and oriented x 3.  SKIN: No obvious rash, lesion, or ulcer.   DATA REVIEW:   CBC  Recent Labs Lab 10/05/15 0606  WBC 5.2  HGB 13.5  HCT 41.9  PLT 152    Chemistries   Recent Labs Lab 10/04/15 1554 10/05/15 0606  NA 141 140  K 3.5 3.9  CL 99* 98*  CO2 33* 33*  GLUCOSE 120* 154*  BUN 19 22*  CREATININE 0.97 0.84  CALCIUM 8.8* 8.5*  MG 1.6*  --   AST 36  --   ALT 32  --   ALKPHOS 80  --   BILITOT 0.5  --     Cardiac Enzymes No results for input(s): TROPONINI in the last 168 hours.   RADIOLOGY:  No  results found.    Management plans discussed with the patient, family and they are in agreement.  CODE STATUS:  Code Status History    Date Active Date Inactive Code Status Order ID Comments User Context   10/04/2015  2:00 PM 10/05/2015  7:49 PM DNR [id number]  Demetrios Loll, MD Inpatient   08/27/2015 10:46 AM 10/04/2015  2:00 PM DNR [id number]  Flora Lipps, MD Outpatient   08/08/2015  3:28 PM 08/11/2015  5:28 PM Full Code [id number]  Gladstone Lighter, MD Inpatient    Questions for Most Recent Historical Code Status (Order [id number])    Question Answer Comment   In the event of cardiac or respiratory ARREST Do not call a "code blue"    In the event of cardiac or respiratory ARREST Do not perform Intubation,  CPR, defibrillation or ACLS    In the event of cardiac or respiratory ARREST Use medication by any route, position, wound care, and other measures to relive pain and suffering. May use oxygen, suction and manual treatment of airway obstruction as needed for comfort.     Advance Directive Documentation        Most Recent Value   Type of Advance Directive  Out of facility DNR (pink MOST or yellow form)   Pre-existing out of facility DNR order (yellow form or pink MOST form)     "MOST" Form in Place?        TOTAL TIME TAKING CARE OF THIS PATIENT: 40 minutes.    Henreitta Leber M.D on 10/06/2015 at 3:35 PM  Between 7am to 6pm - Pager - [phone]  After 6pm go to [email or web address] - password [email or web address] [facility]  [facility]  Office  [phone]  CC: Primary care physician; Adin Hector, MD

## 2015-10-08 ENCOUNTER — Encounter: Payer: Self-pay | Admitting: Internal Medicine

## 2015-10-08 ENCOUNTER — Ambulatory Visit (INDEPENDENT_AMBULATORY_CARE_PROVIDER_SITE_OTHER): Payer: Medicare HMO | Admitting: Internal Medicine

## 2015-10-08 VITALS — BP 108/58 | HR 66 | Wt 135.0 lb

## 2015-10-08 DIAGNOSIS — J8410 Pulmonary fibrosis, unspecified: Secondary | ICD-10-CM

## 2015-10-08 NOTE — Progress Notes (Signed)
   Subjective:    Patient ID: [name], female    DOB: 1924-06-14, 82 y.o.   MRN: [id number]  Synopsis: First seen by [facility] in [id number] for ILD of uncertain etiology previously followed by [facility].   She regularly works out in her basement with a [employer]. 06 04 2013: 6-minute walk, 700 feet, 90% on 3 liters, 80% on 3 liters.  03/05/2012, 6-minute walk, 732 feet, 92% on 2 liters 2013 Spirogram reveals an FVC of 75% pre, 77% post, FEV1 89% pre, 91% post, 25-75 237% pre, 287% post consistent with a mild restrictive ventilatory defect and no definite improvement with bronchodilator therapy.  11/2013 PFT [facility] > Ratio 89%, FEV1 0.99 (82% pred, no change with BD), TLC 2.96L (77% pred), DLCO 5.8 (37% pred)  CC: follow up chronic SOB/ end stage lung disease HPI She had recent hospitalization for acute resp failure started on steroids Patient has been using once a day neb treatments and should be using twice a day  Patient with chronic hypoxic resp failure, but seems to be sitting comfortably Patient resp status is very poor,she is DNR/DNI       Review of Systems  Constitutional: Negative for fever, chills and fatigue.  HENT: Negative for postnasal drip, rhinorrhea and sinus pressure.   Respiratory: Positive for shortness of breath. Negative for cough and wheezing.   Cardiovascular: Negative for chest pain, palpitations and leg swelling.  All other systems reviewed and are negative.       Objective:   Physical Exam Filed Vitals:   10/08/15 1218  BP: 108/58  Pulse: 66  Weight: 135 lb (61.236 kg)  SpO2: 93%     Gen: no acute distress HEENT: NCAT, EOMi, OP clear, neck supple without masses PULM:FINE Crackles in bases bilaterally, no wheezing CV: RRR, systolic murmur noted, no JVD AB: BS+, soft, nontender,  Ext: warm, no edema, no clubbing, no cyanosis Derm: no rash or skin breakdown Neuro: A&Ox4,    May 2013 CT chest reviewed> no pulmonary  embolism, there is significant groundglass interstitial changes with some bronchiectasis in the bases. There is no clear honeycombing. The groundglass and interstitial thickening does not seem to be localized to the periphery. The interstitial changes are more significant in the bases of the lungs than in the upper lobes. December 2015 CT angiogram chest [facility]> no pulmonary embolism, there is peripheral interlobular septal thickening and significant bronchiectasis, worse in the bases. There is no clear honeycombing. There is also increased mediastinal lymphadenopathy    Assessment & Plan:   82 yo pleasant white female with clinical signs and symptoms of END STAGE PULMONARY FIBROSIS with Chronic Oxygen Therapy Patient with very poor prognosis, with chronic resp failure    Postinflammatory pulmonary fibrosis Plan: -advised to use  Pulmicort adn Brovana Neb treatments twice daily -continue oxygen as needed   She is now DNR/DNI status   The Patient requires high complexity decision making for assessment and support, frequent evaluation and titration of therapies. Patient and family are satisfied with Plan of action and management   Elice Crigger Patricia Pesa, M.D.  [facility]  Medical Director [place] Director [facility] Cardio-Pulmonary Department

## 2015-10-08 NOTE — Patient Instructions (Signed)
USE BROVANA AND PULMICORT NEB TREATMENTS 2 TIMES A DAY  FOLLOW UP IN 1 MONTH

## 2015-10-13 ENCOUNTER — Telehealth: Payer: Self-pay | Admitting: Internal Medicine

## 2015-10-13 NOTE — Telephone Encounter (Signed)
Jeani Hawking with [employer] called and stated that pt's insurance is denying nebulizer medications with diagnosis of Pulmonary Fibrosis.  [employer] Medicare will only cover nebulizer medications for Asthma, Bronchitis, COPD or Emphysema.  If unable to provide suitable diagnosis pt's nebulizer and meds will have to be returned. Please advise. Rhonda J Cobb

## 2015-10-14 MED ORDER — ARFORMOTEROL TARTRATE 15 MCG/2ML IN NEBU: 15.0000 ug | INHALATION_SOLUTION | Freq: Two times a day (BID) | RESPIRATORY_TRACT | Status: DC

## 2015-10-14 MED ORDER — BUDESONIDE 0.5 MG/2ML IN SUSP: 0.5000 mg | Freq: Two times a day (BID) | RESPIRATORY_TRACT | Status: DC

## 2015-10-14 NOTE — Telephone Encounter (Signed)
New rx printed for KK to sign. Once signed will give to Suanne Marker to fax.

## 2015-10-14 NOTE — Telephone Encounter (Signed)
bronchitis

## 2015-10-14 NOTE — Telephone Encounter (Signed)
Dr. Mortimer Fries please advise on what diagnosis you woyuld like to use for the nebulizers since we can't use Pulmonary Fibrosis.

## 2015-10-31 ENCOUNTER — Emergency Department: Payer: Medicare HMO

## 2015-10-31 ENCOUNTER — Encounter: Payer: Self-pay | Admitting: Emergency Medicine

## 2015-10-31 ENCOUNTER — Inpatient Hospital Stay
Admission: EM | Admit: 2015-10-31 | Discharge: 2015-11-07 | DRG: 193 | Disposition: A | Discharge status: Home Health | Payer: Medicare HMO | Attending: Internal Medicine | Admitting: Internal Medicine

## 2015-10-31 DIAGNOSIS — Z7952 Long term (current) use of systemic steroids: Secondary | ICD-10-CM | POA: Diagnosis not present

## 2015-10-31 DIAGNOSIS — J9600 Acute respiratory failure, unspecified whether with hypoxia or hypercapnia: Secondary | ICD-10-CM

## 2015-10-31 DIAGNOSIS — Z23 Encounter for immunization: Secondary | ICD-10-CM

## 2015-10-31 DIAGNOSIS — I482 Chronic atrial fibrillation: Secondary | ICD-10-CM | POA: Diagnosis present

## 2015-10-31 DIAGNOSIS — N179 Acute kidney failure, unspecified: Secondary | ICD-10-CM | POA: Diagnosis present

## 2015-10-31 DIAGNOSIS — I959 Hypotension, unspecified: Secondary | ICD-10-CM | POA: Diagnosis present

## 2015-10-31 DIAGNOSIS — Z9981 Dependence on supplemental oxygen: Secondary | ICD-10-CM | POA: Diagnosis not present

## 2015-10-31 DIAGNOSIS — R0600 Dyspnea, unspecified: Secondary | ICD-10-CM

## 2015-10-31 DIAGNOSIS — A419 Sepsis, unspecified organism: Secondary | ICD-10-CM | POA: Diagnosis present

## 2015-10-31 DIAGNOSIS — Z7901 Long term (current) use of anticoagulants: Secondary | ICD-10-CM

## 2015-10-31 DIAGNOSIS — E785 Hyperlipidemia, unspecified: Secondary | ICD-10-CM | POA: Diagnosis present

## 2015-10-31 DIAGNOSIS — Z85828 Personal history of other malignant neoplasm of skin: Secondary | ICD-10-CM

## 2015-10-31 DIAGNOSIS — Z7983 Long term (current) use of bisphosphonates: Secondary | ICD-10-CM | POA: Diagnosis not present

## 2015-10-31 DIAGNOSIS — F05 Delirium due to known physiological condition: Secondary | ICD-10-CM | POA: Diagnosis present

## 2015-10-31 DIAGNOSIS — F0390 Unspecified dementia without behavioral disturbance: Secondary | ICD-10-CM | POA: Diagnosis present

## 2015-10-31 DIAGNOSIS — Z888 Allergy status to other drugs, medicaments and biological substances status: Secondary | ICD-10-CM

## 2015-10-31 DIAGNOSIS — I110 Hypertensive heart disease with heart failure: Secondary | ICD-10-CM | POA: Diagnosis present

## 2015-10-31 DIAGNOSIS — J189 Pneumonia, unspecified organism: Secondary | ICD-10-CM | POA: Diagnosis present

## 2015-10-31 DIAGNOSIS — J9621 Acute and chronic respiratory failure with hypoxia: Secondary | ICD-10-CM | POA: Diagnosis present

## 2015-10-31 DIAGNOSIS — M5090 Cervical disc disorder, unspecified, unspecified cervical region: Secondary | ICD-10-CM | POA: Diagnosis not present

## 2015-10-31 DIAGNOSIS — R410 Disorientation, unspecified: Secondary | ICD-10-CM | POA: Diagnosis not present

## 2015-10-31 DIAGNOSIS — E039 Hypothyroidism, unspecified: Secondary | ICD-10-CM | POA: Diagnosis not present

## 2015-10-31 DIAGNOSIS — J8410 Pulmonary fibrosis, unspecified: Secondary | ICD-10-CM | POA: Diagnosis present

## 2015-10-31 DIAGNOSIS — I350 Nonrheumatic aortic (valve) stenosis: Secondary | ICD-10-CM | POA: Diagnosis present

## 2015-10-31 DIAGNOSIS — I10 Essential (primary) hypertension: Secondary | ICD-10-CM | POA: Diagnosis not present

## 2015-10-31 HISTORY — DX: Chronic atrial fibrillation, unspecified: I48.20

## 2015-10-31 LAB — URINALYSIS COMPLETE WITH MICROSCOPIC (ARMC ONLY)
Bilirubin Urine: NEGATIVE
Glucose, UA: NEGATIVE mg/dL
Hgb urine dipstick: NEGATIVE
Leukocytes, UA: NEGATIVE
NITRITE: NEGATIVE
PH: 5 (ref 5.0–8.0)
PROTEIN: 30 mg/dL — AB
Specific Gravity, Urine: 1.023 (ref 1.005–1.030)

## 2015-10-31 LAB — URINE DRUG SCREEN, QUALITATIVE (ARMC ONLY)
AMPHETAMINES, UR SCREEN: NOT DETECTED
BARBITURATES, UR SCREEN: NOT DETECTED
BENZODIAZEPINE, UR SCRN: NOT DETECTED
CANNABINOID 50 NG, UR ~~LOC~~: NOT DETECTED
Cocaine Metabolite,Ur ~~LOC~~: NOT DETECTED
MDMA (Ecstasy)Ur Screen: NOT DETECTED
Methadone Scn, Ur: NOT DETECTED
Opiate, Ur Screen: NOT DETECTED
PHENCYCLIDINE (PCP) UR S: NOT DETECTED
Tricyclic, Ur Screen: NOT DETECTED

## 2015-10-31 LAB — COMPREHENSIVE METABOLIC PANEL
ALBUMIN: 3.4 g/dL — AB (ref 3.5–5.0)
ALK PHOS: 79 U/L (ref 38–126)
ALT: 23 U/L (ref 14–54)
ANION GAP: 13 (ref 5–15)
AST: 41 U/L (ref 15–41)
BUN: 29 mg/dL — ABNORMAL HIGH (ref 6–20)
CALCIUM: 9.1 mg/dL (ref 8.9–10.3)
CO2: 29 mmol/L (ref 22–32)
CREATININE: 1.19 mg/dL — AB (ref 0.44–1.00)
Chloride: 101 mmol/L (ref 101–111)
GFR calc Af Amer: 45 mL/min — ABNORMAL LOW (ref >60)
GFR calc non Af Amer: 39 mL/min — ABNORMAL LOW (ref >60)
GLUCOSE: 114 mg/dL — AB (ref 65–99)
Potassium: 4.8 mmol/L (ref 3.5–5.1)
SODIUM: 143 mmol/L (ref 135–145)
Total Bilirubin: 0.7 mg/dL (ref 0.3–1.2)
Total Protein: 7.3 g/dL (ref 6.5–8.1)

## 2015-10-31 LAB — CK: CK TOTAL: 91 U/L (ref 38–234)

## 2015-10-31 LAB — BLOOD GAS, ARTERIAL
ACID-BASE EXCESS: 8.2 mmol/L — AB (ref 0.0–3.0)
ACID-BASE EXCESS: 3.6 mmol/L — AB (ref 0.0–3.0)
ALLENS TEST (PASS/FAIL): POSITIVE — AB
BICARBONATE: 29.6 meq/L — AB (ref 21.0–28.0)
Bicarbonate: 32.8 mEq/L — ABNORMAL HIGH (ref 21.0–28.0)
Delivery systems: POSITIVE
Delivery systems: POSITIVE
EXPIRATORY PAP: 5
EXPIRATORY PAP: 5
FIO2: 1
FIO2: 1
INSPIRATORY PAP: 10
Inspiratory PAP: 10
LHR: 10 {breaths}/min
O2 SAT: 89.5 %
O2 SAT: 99.4 %
PO2 ART: 53 mmHg — AB (ref 83.0–108.0)
Patient temperature: 37
Patient temperature: 37
pCO2 arterial: 44 mmHg (ref 32.0–48.0)
pCO2 arterial: 50 mmHg — ABNORMAL HIGH (ref 32.0–48.0)
pH, Arterial: 7.38 (ref 7.350–7.450)
pH, Arterial: 7.48 — ABNORMAL HIGH (ref 7.350–7.450)
pO2, Arterial: 161 mmHg — ABNORMAL HIGH (ref 83.0–108.0)

## 2015-10-31 LAB — DIFFERENTIAL
BASOS ABS: 0.1 10*3/uL (ref 0–0.1)
Basophils Relative: 1 %
Eosinophils Absolute: 0 10*3/uL (ref 0–0.7)
Eosinophils Relative: 1 %
LYMPHS PCT: 10 %
Lymphs Abs: 0.7 10*3/uL — ABNORMAL LOW (ref 1.0–3.6)
Monocytes Absolute: 0.6 10*3/uL (ref 0.2–0.9)
Monocytes Relative: 8 %
NEUTROS ABS: 5.4 10*3/uL (ref 1.4–6.5)
NEUTROS PCT: 80 %

## 2015-10-31 LAB — AMMONIA: AMMONIA: 13 umol/L (ref 9–35)

## 2015-10-31 LAB — CBC
HEMATOCRIT: 38.7 % (ref 35.0–47.0)
HEMOGLOBIN: 12.5 g/dL (ref 12.0–16.0)
MCH: 30 pg (ref 26.0–34.0)
MCHC: 32.3 g/dL (ref 32.0–36.0)
MCV: 92.7 fL (ref 80.0–100.0)
Platelets: 227 10*3/uL (ref 150–440)
RBC: 4.18 MIL/uL (ref 3.80–5.20)
RDW: 16.1 % — AB (ref 11.5–14.5)
WBC: 6.7 10*3/uL (ref 3.6–11.0)

## 2015-10-31 LAB — LACTIC ACID, PLASMA
LACTIC ACID, VENOUS: 1.4 mmol/L (ref 0.5–2.0)
Lactic Acid, Venous: 2.9 mmol/L (ref 0.5–2.0)

## 2015-10-31 LAB — PROTIME-INR
INR: 1.16
Prothrombin Time: 15 seconds (ref 11.4–15.0)

## 2015-10-31 LAB — ETHANOL

## 2015-10-31 LAB — APTT: APTT: 31 s (ref 24–36)

## 2015-10-31 LAB — LIPASE, BLOOD: LIPASE: 24 U/L (ref 11–51)

## 2015-10-31 LAB — TROPONIN I: Troponin I: 0.15 ng/mL — ABNORMAL HIGH (ref <0.031)

## 2015-10-31 MED ORDER — DILTIAZEM HCL ER COATED BEADS 180 MG PO CP24
180.0000 mg | ORAL_CAPSULE | Freq: Every day | ORAL | Status: DC
  Administered 2015-11-01 – 2015-11-06 (×5): 180 mg via ORAL
  Filled 2015-10-31 (×6): qty 1

## 2015-10-31 MED ORDER — IPRATROPIUM-ALBUTEROL 0.5-2.5 (3) MG/3ML IN SOLN
3.0000 mL | Freq: Four times a day (QID) | RESPIRATORY_TRACT | Status: DC
  Administered 2015-11-01 – 2015-11-04 (×16): 3 mL via RESPIRATORY_TRACT
  Filled 2015-10-31 (×17): qty 3

## 2015-10-31 MED ORDER — IPRATROPIUM-ALBUTEROL 0.5-2.5 (3) MG/3ML IN SOLN
3.0000 mL | Freq: Four times a day (QID) | RESPIRATORY_TRACT | Status: DC
  Filled 2015-10-31: qty 3

## 2015-10-31 MED ORDER — HEPARIN SODIUM (PORCINE) 5000 UNIT/ML IJ SOLN: 5000.0000 [IU] | Freq: Three times a day (TID) | INTRAMUSCULAR | Status: DC

## 2015-10-31 MED ORDER — BUDESONIDE 0.5 MG/2ML IN SUSP
0.5000 mg | Freq: Two times a day (BID) | RESPIRATORY_TRACT | Status: DC
  Administered 2015-11-01 – 2015-11-07 (×13): 0.5 mg via RESPIRATORY_TRACT
  Filled 2015-10-31 (×15): qty 2

## 2015-10-31 MED ORDER — BRIMONIDINE TARTRATE-TIMOLOL 0.2-0.5 % OP SOLN: 1.0000 [drp] | Freq: Two times a day (BID) | OPHTHALMIC | Status: DC

## 2015-10-31 MED ORDER — SODIUM CHLORIDE 0.9 % IV BOLUS (SEPSIS)
1000.0000 mL | INTRAVENOUS | Status: AC
  Administered 2015-10-31: 1000 mL via INTRAVENOUS

## 2015-10-31 MED ORDER — DEXTROSE-NACL 5-0.2 % IV SOLN
INTRAVENOUS | Status: DC
  Administered 2015-11-01: via INTRAVENOUS

## 2015-10-31 MED ORDER — ALBUTEROL SULFATE (2.5 MG/3ML) 0.083% IN NEBU
5.0000 mg | INHALATION_SOLUTION | Freq: Once | RESPIRATORY_TRACT | Status: AC
  Administered 2015-10-31: 5 mg via RESPIRATORY_TRACT
  Filled 2015-10-31: qty 6

## 2015-10-31 MED ORDER — METHYLPREDNISOLONE SODIUM SUCC 125 MG IJ SOLR
60.0000 mg | Freq: Two times a day (BID) | INTRAMUSCULAR | Status: DC
  Administered 2015-11-01 (×3): 60 mg via INTRAVENOUS
  Filled 2015-10-31 (×4): qty 2

## 2015-10-31 MED ORDER — DEXTROSE 5 % IV SOLN
2.0000 g | INTRAVENOUS | Status: DC
  Administered 2015-11-01: 2 g via INTRAVENOUS
  Filled 2015-10-31 (×2): qty 2

## 2015-10-31 MED ORDER — LATANOPROST 0.005 % OP SOLN
1.0000 [drp] | Freq: Every day | OPHTHALMIC | Status: DC
  Administered 2015-11-03 – 2015-11-06 (×5): 1 [drp] via OPHTHALMIC
  Filled 2015-10-31 (×2): qty 2.5

## 2015-10-31 MED ORDER — IRBESARTAN 75 MG PO TABS
37.5000 mg | ORAL_TABLET | Freq: Every day | ORAL | Status: DC
  Administered 2015-11-01 – 2015-11-07 (×6): 37.5 mg via ORAL
  Filled 2015-10-31: qty 1
  Filled 2015-10-31 (×2): qty 2
  Filled 2015-10-31: qty 1
  Filled 2015-10-31: qty 3
  Filled 2015-10-31: qty 1

## 2015-10-31 MED ORDER — LEVOTHYROXINE SODIUM 50 MCG PO TABS
50.0000 ug | ORAL_TABLET | Freq: Every day | ORAL | Status: DC
  Administered 2015-11-01 – 2015-11-07 (×6): 50 ug via ORAL
  Filled 2015-10-31: qty 1
  Filled 2015-10-31: qty 2
  Filled 2015-10-31: qty 1
  Filled 2015-10-31: qty 2
  Filled 2015-10-31 (×2): qty 1

## 2015-10-31 MED ORDER — CLOBETASOL PROPIONATE 0.05 % EX CREA
1.0000 "application " | TOPICAL_CREAM | Freq: Two times a day (BID) | CUTANEOUS | Status: DC
  Administered 2015-11-01 – 2015-11-06 (×8): 1 via TOPICAL
  Filled 2015-10-31: qty 15

## 2015-10-31 MED ORDER — DEXTROSE 5 % IV SOLN
2.0000 g | Freq: Once | INTRAVENOUS | Status: AC
  Administered 2015-10-31: 2 g via INTRAVENOUS
  Filled 2015-10-31: qty 2

## 2015-10-31 MED ORDER — APIXABAN 5 MG PO TABS
5.0000 mg | ORAL_TABLET | Freq: Two times a day (BID) | ORAL | Status: DC
  Administered 2015-11-01 – 2015-11-04 (×8): 5 mg via ORAL
  Filled 2015-10-31 (×10): qty 1

## 2015-10-31 MED ORDER — VANCOMYCIN HCL IN DEXTROSE 1-5 GM/200ML-% IV SOLN
1000.0000 mg | INTRAVENOUS | Status: DC
  Filled 2015-10-31: qty 200

## 2015-10-31 MED ORDER — VANCOMYCIN HCL IN DEXTROSE 1-5 GM/200ML-% IV SOLN
1000.0000 mg | Freq: Once | INTRAVENOUS | Status: AC
  Administered 2015-10-31: 1000 mg via INTRAVENOUS
  Filled 2015-10-31: qty 200

## 2015-10-31 MED ORDER — ONDANSETRON HCL 4 MG/2ML IJ SOLN
4.0000 mg | Freq: Four times a day (QID) | INTRAMUSCULAR | Status: DC | PRN
  Filled 2015-10-31: qty 2

## 2015-10-31 NOTE — Progress Notes (Signed)
Pharmacy Antibiotic Note  [name] is a 82 y.o. female admitted on 10/31/2015 with pneumonia.  Pharmacy has been consulted for vancomycin and cefepime dosing.  Plan: DW 54kg Vd 38L kei 0.037 hr-1  t1/2 26 hours Vancomycin 1 gram q 36 hours ordered with stacked dosing. Level before 4th dose. Goal trough 15-20.  Cefepime 2 grams q 24 hours ordered.  Weight: 160 lb 3.2 oz (72.666 kg)  Temp (24hrs), Avg:100 F (37.8 C), Min:99.4 F (37.4 C), Max:100.6 F (38.1 C)   Recent Labs Lab 10/31/15 1845 10/31/15 2138  WBC 6.7  --   CREATININE 1.19*  --   LATICACIDVEN 2.9* 1.4    Estimated Creatinine Clearance: 26.6 mL/min (by C-G formula based on Cr of 1.19).    Allergies  Allergen Reactions  . Lipitor [Atorvastatin] Nausea And Vomiting  . Simvastatin Nausea And Vomiting    Antimicrobials this admission: vancomycin cefepime >>    >>   Dose adjustments this admission:   Microbiology results: 4/23 BCx: pending 4/23 UCx: pending  4/23 Sputum: pending    4/23 CXR: bilateral opacities. 4/23 UA: (-)  Thank you for allowing pharmacy to be a part of this patient's care.  [name] 10/31/2015 11:00 PM

## 2015-10-31 NOTE — ED Notes (Signed)
Respiratory called for albuterol treatment since pt is on BiPap. Respiratory said that once they were done on the floors he would come and administer treatment.

## 2015-10-31 NOTE — ED Notes (Addendum)
Patient from home lives alone, found on floor by son without oxygen on. Transport via [employer]. Unknown downtime. C/o bilateral shoulder pain and generalized weakness. Patient is arousable to voice and confused.

## 2015-10-31 NOTE — ED Notes (Signed)
MD Joni Fears still attempting US Guided IV.

## 2015-10-31 NOTE — ED Notes (Signed)
IV Blood retrieval x2 attempted. Not success. MD Joni Fears at bedside attempting US Guided.

## 2015-10-31 NOTE — H&P (Signed)
PULMONARY / CRITICAL CARE MEDICINE   Name: [name] MRN: [id number] DOB: 09-23-1923    ADMISSION DATE:  10/31/2015   CHIEF COMPLAINT:    HISTORY OF PRESENT ILLNESS:   82 YO Caucasian female with a PMH of hypertension, valvular disease, end-stage pulmonary fibrosis on home O2 4L, chronic afib, and bronchitis who presents from home via EMS after being found on the floor by her son. History is obtained from ED/EMS records as patient is confused. Exact downtime is unknown but she was last known to be normal around 8 pm on 4/23. Per patient's son, her O2 was found in the bathroom and patient was found on her bedroom floor.  Upon EMS arrival, patient was found to be hypoxic with SPO2 in the 40s, weak and confused. Her initial blood pressure was also low with systolic in the low [id number]. At the ED, patient was tachypneic, hypoxic on [place], febrile and confused. She was placed on BiPAP and her SPO2 improved to the 90s. Her diagnostic workup showed a lactic acid level of 2.9, and interstitia lung disease with bilateral atelectasis versus infiltrate on CXR. She is being admitted for acute on chronic hypoxic respiratory failure, and presumed HCAP.  She was recently hospitalized for acute respiratory failure from 3/27-3/28.   PAST MEDICAL HISTORY :  She  has a past medical history of Chronic cough (02/2011); Psoriasis; Hypertension; Arthritis; Pelvis fracture ([place]) (2012); History of dizziness; Valvular heart disease; Cervical disc disease; appendectomy; Hyperlipidemia; Cancer ([place]); and Pulmonary fibrosis ([place]).  PAST SURGICAL HISTORY: She  has past surgical history that includes Back surgery (2007); Total abdominal hysterectomy w/ bilateral salpingoophorectomy; Skin cancer excision; Cardiac catheterization (2006); and cataract surgery.  Allergies  Allergen Reactions  . Lipitor [Atorvastatin] Nausea And Vomiting  . Simvastatin Nausea And Vomiting    No current facility-administered medications on file  prior to encounter.   Current Outpatient Prescriptions on File Prior to Encounter  Medication Sig  . alendronate (FOSAMAX) 70 MG tablet Take 70 mg by mouth once a week. Take on Fridays.  Marland Kitchen apixaban (ELIQUIS) 5 MG TABS tablet Take 1 tablet (5 mg total) by mouth 2 (two) times daily.  Marland Kitchen arformoterol (BROVANA) 15 MCG/2ML NEBU Take 2 mLs (15 mcg total) by nebulization 4 (four) times daily - after meals and at bedtime.  Marland Kitchen arformoterol (BROVANA) 15 MCG/2ML NEBU Take 2 mLs (15 mcg total) by nebulization 2 (two) times daily.  Marland Kitchen azelastine (ASTELIN) 0.1 % nasal spray Place 1 spray into both nostrils 2 (two) times daily as needed. For congestion.  . bimatoprost (LUMIGAN) 0.03 % ophthalmic solution Place 1 drop into both eyes 2 (two) times daily.   . budesonide (PULMICORT) 0.5 MG/2ML nebulizer solution Take 2 mLs (0.5 mg total) by nebulization 2 (two) times daily.  . budesonide (PULMICORT) 0.5 MG/2ML nebulizer solution Take 2 mLs (0.5 mg total) by nebulization 2 (two) times daily.  . clobetasol cream (TEMOVATE) [id number] % Apply 1 application topically 2 (two) times daily as needed. For psoriasis.  . COMBIGAN 0.2-0.5 % ophthalmic solution Place 1 drop into both eyes 2 (two) times daily.  Marland Kitchen diltiazem (CARDIZEM CD) 180 MG 24 hr capsule Take 180 mg by mouth daily.  Marland Kitchen doxycycline (VIBRA-TABS) 100 MG tablet Take 1 tablet (100 mg total) by mouth 2 (two) times daily.  . furosemide (LASIX) 40 MG tablet Take 1 tablet by mouth daily.  Marland Kitchen levothyroxine (SYNTHROID, LEVOTHROID) 50 MCG tablet Take 50 mcg by mouth daily before breakfast.  . OXYGEN  Inhale 4 L into the lungs continuous.  . potassium chloride (MICRO-K) 10 MEQ CR capsule Take 10 mEq by mouth daily.   . predniSONE (DELTASONE) 10 MG tablet Take 6 pills for 3 days, then take 4 pills for 3 days, then take 2 pills until you see your Pulmonologist.  . zolpidem (AMBIEN) 5 MG tablet Take 1 tablet (5 mg total) by mouth at bedtime.    FAMILY HISTORY:  Her indicated that  her mother is deceased. She indicated that her father is deceased. She indicated that her brother is deceased.   SOCIAL HISTORY: She  reports that she has never smoked. She has never used smokeless tobacco. She reports that she drinks alcohol. She reports that she does not use illicit drugs.  REVIEW OF SYSTEMS:   Unable to obtain due to patient's mental status  SUBJECTIVE:   VITAL SIGNS: BP 130/77 mmHg  Pulse 78  Temp(Src) 99.7 F (37.6 C)  Resp 17  Wt 160 lb 3.2 oz (72.666 kg)  SpO2 100%  HEMODYNAMICS:    VENTILATOR SETTINGS:    INTAKE / OUTPUT:    PHYSICAL EXAMINATION: General:  Chronically ill looking, mild respiratory distress Neuro: AAO X1 , speech is clear, moves all extremities HEENT: PERRLA, oral mucosa dry Cardiovascular: RRR, S1/S2, no MRG Lungs: Labored, diminished airflow bilaterally, greater in the bases, +end expiratory wheezes, bilateral rales in the bases Abdomen: Non-distended, normal bowel sounds, no palpable organomegaly Musculoskeletal: +rom in Bilateral extremities  Extremities: No edema, +2 pulses Skin:  Warm and dry  LABS:  BMET  Recent Labs Lab 10/31/15 1845  NA 143  K 4.8  CL 101  CO2 29  BUN 29*  CREATININE 1.19*  GLUCOSE 114*    Electrolytes  Recent Labs Lab 10/31/15 1845  CALCIUM 9.1    CBC  Recent Labs Lab 10/31/15 1845  WBC 6.7  HGB 12.5  HCT 38.7  PLT 227    Coag's  Recent Labs Lab 10/31/15 1845  APTT 31  INR 1.16    Sepsis Markers  Recent Labs Lab 10/31/15 1845 10/31/15 2138  LATICACIDVEN 2.9* 1.4    ABG  Recent Labs Lab 10/31/15 1822  PHART 7.48*  PCO2ART 44  PO2ART 53*    Liver Enzymes  Recent Labs Lab 10/31/15 1845  AST 41  ALT 23  ALKPHOS 79  BILITOT 0.7  ALBUMIN 3.4*    Cardiac Enzymes  Recent Labs Lab 10/31/15 1845  TROPONINI 0.15*    Glucose No results for input(s): GLUCAP in the last 168 hours.  Imaging Ct Head Wo Contrast  10/31/2015  CLINICAL DATA:   Acute onset altered mental status approximately 15 minutes ago. Initial encounter. EXAM: CT HEAD WITHOUT CONTRAST TECHNIQUE: Contiguous axial images were obtained from the base of the skull through the vertex without intravenous contrast. COMPARISON:  Head CT scan 08/19/2015.  Brain MRI 04/13/2014. FINDINGS: Arachnoid cyst in the left parietal lobe is unchanged. There is extensive chronic microvascular ischemic change. No evidence of acute abnormality including hemorrhage, infarct, mass lesion, midline shift or subdural hemorrhage is identified. The calvarium is intact. Chronic right mastoid effusion is noted. Left mastoid effusion seen on the most recent examination has resolved. IMPRESSION: No acute abnormality. Extensive chronic microvascular ischemic change. No change in an arachnoid cyst in the left parietal lobe. These results were called by telephone at the time of interpretation on 10/31/2015 at 5:56 pm to Dr. Carrie Mew , who verbally acknowledged these results. Electronically Signed   By: Marcello Moores  Dalessio M.D.   On: 10/31/2015 18:00   Dg Chest Portable 1 View  10/31/2015  CLINICAL DATA:  Wheezing.  Confusion. EXAM: PORTABLE CHEST 1 VIEW COMPARISON:  10/04/2015 chest radiograph. FINDINGS: Stable cardiomediastinal silhouette with mild cardiomegaly. No pneumothorax. No definite pleural effusion. Prominent reticular opacities throughout the mid to lower lungs bilaterally, a chronic finding back to at least 06/25/2014 chest radiograph, not appreciably changed. No acute consolidative airspace disease. IMPRESSION: Stable mild cardiomegaly. Chronic prominent reticular lung opacities in the mid to lower lungs bilaterally, not appreciably changed in the interval. Although a component of mild pulmonary edema cannot be excluded, the findings are more suggestive of a chronic interstitial lung disease. Electronically Signed   By: Ilona Sorrel M.D.   On: 10/31/2015 18:25    STUDIES:  Last echo 3/28EF 60-65%,  mild LVH, mid-moderate aortic stenosis, MR,TR   CULTURES: MRSA screen>> Blood x2 > Urine>  ANTIBIOTICS: Vancomycin and cefepime  SIGNIFICANT EVENTS: 04/23>ED>Admitted with acute on chronic respiratory failure  LINES/TUBES: PIVs FOLEY  DISCUSSION: 82 YO WF with End-stage pulmonary fibrosis presenting with acute on chronic respiratory  ASSESSMENT / PLAN:  PULMONARY A: Acute on chronic hypoxic respiratory failure HCAP End-stage pulmonary fibrosis P:   -Continuous BiPAP; titrate to maintain spo2 88-94% -Titrate to HFNC as tolerated -Nebulized steroids and bronchodilators -Empiric antibiotics  -IV steroids  CARDIOVASCULAR A:  Chronic Afib Hypotension -doubt septic shock Valvular disease P:  -Hemodynamics per ICU protocol -Continue home dose of Eliquis -EKG -Continue diltiazem and avapro  RENAL A:   AKI P:   -Gentle hydration -Monitor I/O -Monitor and replace electrolytes   INFECTIOUS A:   HCAP Doubt sepsis  P:   -Continue empiric abx and f/u cultures  ENDOCRINE A:   Hypothyroidism P:   -Continue synthroid -Check TSH  NEUROLOGIC A:   Acute hypoxic encephalopathy-Improving P:   RASS goal:  -Monitor mental status   Disposition and family update:  No family at bedside. Further changes in treatment plan pending clinical course and diagnostics  Best Practice: Code Status:  DNR/DNI Diet: NPO with sips and meds GI prophylaxis:not indicated   VTE prophylaxis:  SCD's / Eliquis  Total CCM time is 60 minutes  Magdalene S. Tukov ANP-BC [facility] Pager [phone]   10/31/2015, 11:47 PM  Patient is seen and examined along with the nurse practitioner, I have reviewed the patient's note as well as the assessment and plan and agree as above, except where amended. Patient is a 82 year old female with a history of pulmonary fibrosis. Her CODE STATUS is DO NOT RESUSCITATE. She presents with acute story  failure, she has a history of pulmonary fibrosis presents now with pneumonia. Review of her chest x-ray shows right lower lobe infiltrate, cultures are pending, MRSA screen is negative. Review of ABG overnight showed a pH of 7.35/53/147/29.3 while on high flow nasal cannula. Overall, this appears consistent with pneumonia with underlying pulmonary fibrosis, we'll continue current antibiotic coverage, can DC vancomycin. As the patient's MRSA screen was negative.  Marda Stalker M.D. 11/01/2015 Critical Care Attestation.  I have personally obtained a history, examined the patient, evaluated laboratory and imaging results, formulated the assessment and plan and placed orders. The Patient requires high complexity decision making for assessment and support, frequent evaluation and titration of therapies, application of advanced monitoring technologies and extensive interpretation of multiple databases. The patient has critical illness that could lead imminently to failure of 1 or more organ systems and requires the highest  level of physician preparedness to intervene.  Critical Care Time devoted to patient care services described in this note is 40 minutes and is exclusive of time spent in procedures.

## 2015-10-31 NOTE — ED Provider Notes (Signed)
[facility] Emergency Department Provider Note  ____________________________________________  Time seen: 5:40 PM on arrival by EMS  I have reviewed the triage vital signs and the nursing notes.   HISTORY  Chief Complaint Code Sepsis Level 5 caveat:  Portions of the history and physical were unable to be obtained due to the patient's acute illness.  Additional history obtained from the son when he arrived to bedside   HPI [name] is a 82 y.o. female who was found on the floor of her bedroom by her son this morning. Last known well was 8 PM last night. The patient has appeared to be confused to him and did not recognize him at home. He also felt that she was very weak all over. Son does note that she had left her oxygen in the bathroom when returning to her bed and this may be due to severe hypoxia.  She was recently hospitalized 2 weeks ago for exacerbation of her chronic hypoxic respiratory failure with pulmonary fibrosis. With bronchodilators and a course of steroids this is improved back to her baseline. Her usual oxygen saturation is about 85-90% on 4 L nasal cannula.  Son is worried that she may have been lying on the floor all night.     Past Medical History  Diagnosis Date  . Chronic cough 02/2011    CXR with increased interstitial markings [facility] pulmonology  . Psoriasis     elbows  . Hypertension   . Arthritis   . Pelvis fracture ([place]) 2012  . History of dizziness   . Valvular heart disease   . Cervical disc disease   . Hx of appendectomy   . Hyperlipidemia   . Cancer ([place])     skin  . Pulmonary fibrosis ([place])     on 4L home oxygen     Patient Active Problem List   Diagnosis Date Noted  . Acute on chronic respiratory failure with hypoxia ([place]) 10/04/2015  . Hypoxia 08/08/2015  . Mediastinal lymphadenopathy 07/15/2014  . Postinflammatory pulmonary fibrosis ([place]) 08/26/2012  . Cough 08/26/2012  . Irregular heart beat 08/26/2012  .  Shortness of breath 04/05/2012  . Valvular heart disease 04/05/2012     Past Surgical History  Procedure Laterality Date  . Back surgery  2007  . Total abdominal hysterectomy w/ bilateral salpingoophorectomy    . Skin cancer excision    . Cardiac catheterization  2006  . Cataract surgery       Current Outpatient Rx  Name  Route  Sig  Dispense  Refill  . alendronate (FOSAMAX) 70 MG tablet   Oral   Take 70 mg by mouth once a week. Take on Fridays.         Marland Kitchen apixaban (ELIQUIS) 5 MG TABS tablet   Oral   Take 1 tablet (5 mg total) by mouth 2 (two) times daily.   60 tablet   0   . arformoterol (BROVANA) 15 MCG/2ML NEBU   Nebulization   Take 2 mLs (15 mcg total) by nebulization 2 (two) times daily.   240 mL   3     Dx: Bronchitis   DX Code: J42   . azelastine (ASTELIN) 0.1 % nasal spray   Each Nare   Place 1 spray into both nostrils 2 (two) times daily as needed. For congestion.      5   . BENICAR 20 MG tablet   Oral   Take 20 tablets by mouth daily.  11     Dispense as written.   . bimatoprost (LUMIGAN) 0.03 % ophthalmic solution   Both Eyes   Place 1 drop into both eyes 2 (two) times daily.          . budesonide (PULMICORT) 0.5 MG/2ML nebulizer solution   Nebulization   Take 2 mLs (0.5 mg total) by nebulization 2 (two) times daily.   120 mL   3     DX: Bronchitis  DX Code:  J42   . clobetasol cream (TEMOVATE) 0.05 %   Topical   Apply 1 application topically 2 (two) times daily as needed. For psoriasis.         . COMBIGAN 0.2-0.5 % ophthalmic solution   Both Eyes   Place 1 drop into both eyes 2 (two) times daily.      5   . diltiazem (CARDIZEM CD) 180 MG 24 hr capsule   Oral   Take 180 mg by mouth daily.      10   . furosemide (LASIX) 40 MG tablet   Oral   Take 1 tablet by mouth daily.         Marland Kitchen levothyroxine (SYNTHROID, LEVOTHROID) 50 MCG tablet   Oral   Take 50 mcg by mouth daily before breakfast.         . OXYGEN    Inhalation   Inhale 4 L into the lungs continuous.         . potassium chloride (MICRO-K) 10 MEQ CR capsule   Oral   Take 10 mEq by mouth daily.       11   . zolpidem (AMBIEN) 5 MG tablet   Oral   Take 1 tablet (5 mg total) by mouth at bedtime.   30 tablet   0   . arformoterol (BROVANA) 15 MCG/2ML NEBU   Nebulization   Take 2 mLs (15 mcg total) by nebulization 4 (four) times daily - after meals and at bedtime.   180 mL   3   . budesonide (PULMICORT) 0.5 MG/2ML nebulizer solution   Nebulization   Take 2 mLs (0.5 mg total) by nebulization 2 (two) times daily.   120 mL   3   . doxycycline (VIBRA-TABS) 100 MG tablet   Oral   Take 1 tablet (100 mg total) by mouth 2 (two) times daily.   14 tablet   0   . predniSONE (DELTASONE) 10 MG tablet      Take 6 pills for 3 days, then take 4 pills for 3 days, then take 2 pills until you see your Pulmonologist.   50 tablet   0      Allergies Lipitor and Simvastatin   Family History  Problem Relation Age of Onset  . CAD Father   . CAD Mother     Social History Social History  Substance Use Topics  . Smoking status: Never Smoker   . Smokeless tobacco: Never Used     Comment: exposed to second hand smoke  . Alcohol Use: 0.0 oz/week    0 Standard drinks or equivalent per week     Comment: occasional wine    Review of Systems  Constitutional:   No fever or chills.  Eyes:   No vision changes.  ENT:   No sore throat. No rhinorrhea. Cardiovascular:   No chest pain. Respiratory:   Positive shortness of breath and cough Gastrointestinal:   Negative for abdominal pain, vomiting and diarrhea.  No bloody stool. Genitourinary:  Negative for dysuria or difficulty urinating. Musculoskeletal:   Negative for focal pain or swelling Neurological:   Generalized weakness, no headaches  ____________________________________________   PHYSICAL EXAM:  VITAL SIGNS: ED Triage Vitals  Enc Vitals Group     BP 10/31/15 0730 93/41  mmHg     Pulse Rate 10/31/15 0730 95     Resp 10/31/15 0730 28     Temp --      Temp src --      SpO2 10/31/15 0730 44 %     Weight 10/31/15 0730 160 lb 3.2 oz (72.666 kg)     Height --      Head Cir --      Peak Flow --      Pain Score --      Pain Loc --      Pain Edu? --      Excl. in [place]? --     Vital signs reviewed, nursing assessments reviewed.   Constitutional:   Awake but stuporous. Ill-appearing, respiratory distress. Eyes:   No scleral icterus. No conjunctival pallor. PERRL. EOMI ENT   Head:   Normocephalic and atraumatic.   Nose:   No congestion/rhinnorhea. No septal hematoma   Mouth/Throat:   Dry mucous membranes, no pharyngeal erythema. No peritonsillar mass.    Neck:   No stridor. No SubQ emphysema. No meningismus. Hematological/Lymphatic/Immunilogical:   No cervical lymphadenopathy. Cardiovascular:   RRR heart rate 90-100. Symmetric bilateral radial and DP pulses.  No murmurs.  Respiratory:   Tachypnea, diffuse rhonchi, slight expiratory wheezing.. Gastrointestinal:   Soft and nontender. Non distended. There is no CVA tenderness.  No rebound, rigidity, or guarding. Genitourinary:   deferred Musculoskeletal:   Nontender with normal range of motion in all extremities. No joint effusions.  No lower extremity tenderness.  No edema. Neurologic:   Mumbling speech Cranial nerves II through X intact Follows commands Diffuse motor weakness, but symmetric. Appears to be very effort limited. Symmetric strength in all major muscle groups. Reflexes symmetric Skin:    Skin is warm, dry and intact. No rash noted.  No petechiae, purpura, or bullae.  ____________________________________________    LABS (pertinent positives/negatives) (all labs ordered are listed, but only abnormal results are displayed) Labs Reviewed  CBC - Abnormal; Notable for the following:    RDW 16.1 (*)    All other components within normal limits  DIFFERENTIAL - Abnormal; Notable for  the following:    Lymphs Abs 0.7 (*)    All other components within normal limits  COMPREHENSIVE METABOLIC PANEL - Abnormal; Notable for the following:    Glucose, Bld 114 (*)    BUN 29 (*)    Creatinine, Ser 1.19 (*)    Albumin 3.4 (*)    GFR calc non Af Amer 39 (*)    GFR calc Af Amer 45 (*)    All other components within normal limits  TROPONIN I - Abnormal; Notable for the following:    Troponin I 0.15 (*)    All other components within normal limits  URINALYSIS COMPLETEWITH MICROSCOPIC ([facility] ONLY) - Abnormal; Notable for the following:    Color, Urine AMBER (*)    APPearance CLEAR (*)    Ketones, ur TRACE (*)    Protein, ur 30 (*)    Bacteria, UA RARE (*)    Squamous Epithelial / LPF 0-5 (*)    All other components within normal limits  LACTIC ACID, PLASMA - Abnormal; Notable for the following:  Lactic Acid, Venous 2.9 (*)    All other components within normal limits  BLOOD GAS, ARTERIAL - Abnormal; Notable for the following:    pH, Arterial 7.48 (*)    pO2, Arterial 53 (*)    Bicarbonate 32.8 (*)    Acid-Base Excess 8.2 (*)    Allens test (pass/fail) POSITIVE (*)    All other components within normal limits  URINE CULTURE  CULTURE, BLOOD (ROUTINE X 2)  CULTURE, BLOOD (ROUTINE X 2)  CULTURE, EXPECTORATED SPUTUM-ASSESSMENT  ETHANOL  PROTIME-INR  APTT  URINE DRUG SCREEN, QUALITATIVE ([facility] ONLY)  CK  LIPASE, BLOOD  AMMONIA  LACTIC ACID, PLASMA   ____________________________________________   EKG  Interpreted by me Sinus rhythm rate of 96, left axis, normal intervals. Normal QRS ST segments and T waves.  ____________________________________________    RADIOLOGY  CT head unremarkable Chest x-ray consistent with chronic interstitial lung disease without any overt acute findings ____________________________________________   PROCEDURES CRITICAL CARE Performed by: Joni Fears, Chelbi Herber   Total critical care time: 35 minutes  Critical care time was  exclusive of separately billable procedures and treating other patients.  Critical care was necessary to treat or prevent imminent or life-threatening deterioration.  Critical care was time spent personally by me on the following activities: development of treatment plan with patient and/or surrogate as well as nursing, discussions with consultants, evaluation of patient's response to treatment, examination of patient, obtaining history from patient or surrogate, ordering and performing treatments and interventions, ordering and review of laboratory studies, ordering and review of radiographic studies, pulse oximetry and re-evaluation of patient's condition.   ____________________________________________   INITIAL IMPRESSION / ASSESSMENT AND PLAN / ED COURSE  Pertinent labs & imaging results that were available during my care of the patient were reviewed by me and considered in my medical decision making (see chart for details).  Patient presents with difficulty breathing and altered mental status and diffuse generalized weakness. Before the son arrived to the bedside, initial report was obtained from EMS who noted that during their transport the patient was awake and conversant and had good strength. They're concerned that on arrival to the treatment room, the patient seemed to become suddenly confused with generalized weakness. A code stroke was therefore called for a stat CT head. While the patient was in the CT scan, the patient's son arrived and was able to relate to me that how his mother looks right now is how she has been looking since he saw her at home earlier today. Last known well was 8 PM yesterday.  Code stroke was then canceled. CT was unremarkable for any acute findings. We then found that she had a borderline low blood pressure of about 80/40. Also febrile. Combined with hypoxia with a saturation of about 40-50% on her usual 4 L nasal cannula and altered mental status, a code sepsis  was initiated. Patient given cefepime and vancomycin IV for healthcare associated pneumonia given her severe chronic pulmonary issues and recent hospitalization 2 weeks ago. Patient was started on BiPAP which improved her oxygen saturation to about 90% which is her chronic baseline.  Laboratory evaluation is significant for a lactate of 2.9. Otherwise unremarkable. Creatinine is at baseline. She's remained persistently febrile and tachypneic and requiring substantial respiratory support. I confirm with her son that she is DO NOT RESUSCITATE status but they are okay with BiPAP to assist with respiratory support for now. On the BiPAP, the patient's mental status has returned to normal and she is conversant  and appropriate and lucid with her son and me. She is much more comfortable. We'll continue antibiotics, steroids, bronchodilators.  I discussed the case with the intensivist on call at 9:30 PM for admission to ICU.     ____________________________________________   FINAL CLINICAL IMPRESSION(S) / ED DIAGNOSES  Final diagnoses:  Acute on chronic respiratory failure with hypoxia [facility])  Healthcare associated pneumonia with sepsis     Portions of this note were generated with dragon dictation software. Dictation errors may occur despite best attempts at proofreading.   Carrie Mew, MD 10/31/15 2131

## 2015-11-01 ENCOUNTER — Encounter: Payer: Self-pay | Admitting: Adult Health

## 2015-11-01 ENCOUNTER — Inpatient Hospital Stay: Payer: Medicare HMO

## 2015-11-01 LAB — BLOOD GAS, ARTERIAL
ACID-BASE EXCESS: 2.8 mmol/L (ref 0.0–3.0)
Bicarbonate: 29.3 mEq/L — ABNORMAL HIGH (ref 21.0–28.0)
DELIVERY SYSTEMS: POSITIVE
Expiratory PAP: 6
FIO2: 100
INSPIRATORY PAP: 12
O2 Saturation: 99.2 %
PATIENT TEMPERATURE: 37
PCO2 ART: 53 mmHg — AB (ref 32.0–48.0)
PO2 ART: 147 mmHg — AB (ref 83.0–108.0)
pH, Arterial: 7.35 (ref 7.350–7.450)

## 2015-11-01 LAB — BASIC METABOLIC PANEL
Anion gap: 9 (ref 5–15)
BUN: 27 mg/dL — ABNORMAL HIGH (ref 6–20)
CALCIUM: 8.1 mg/dL — AB (ref 8.9–10.3)
CHLORIDE: 107 mmol/L (ref 101–111)
CO2: 26 mmol/L (ref 22–32)
CREATININE: 1.2 mg/dL — AB (ref 0.44–1.00)
GFR calc non Af Amer: 39 mL/min — ABNORMAL LOW (ref >60)
GFR, EST AFRICAN AMERICAN: 45 mL/min — AB (ref >60)
Glucose, Bld: 141 mg/dL — ABNORMAL HIGH (ref 65–99)
Potassium: 4.6 mmol/L (ref 3.5–5.1)
Sodium: 142 mmol/L (ref 135–145)

## 2015-11-01 LAB — MAGNESIUM: Magnesium: 2 mg/dL (ref 1.7–2.4)

## 2015-11-01 LAB — MRSA PCR SCREENING: MRSA BY PCR: NEGATIVE

## 2015-11-01 LAB — GLUCOSE, CAPILLARY: Glucose-Capillary: 117 mg/dL — ABNORMAL HIGH (ref 65–99)

## 2015-11-01 LAB — PHOSPHORUS: PHOSPHORUS: 3.2 mg/dL (ref 2.5–4.6)

## 2015-11-01 MED ORDER — TIMOLOL MALEATE 0.5 % OP SOLN
1.0000 [drp] | Freq: Two times a day (BID) | OPHTHALMIC | Status: DC
  Administered 2015-11-01 – 2015-11-07 (×13): 1 [drp] via OPHTHALMIC
  Filled 2015-11-01: qty 5

## 2015-11-01 MED ORDER — CHLORHEXIDINE GLUCONATE 0.12 % MT SOLN
15.0000 mL | Freq: Two times a day (BID) | OROMUCOSAL | Status: DC
  Administered 2015-11-01 – 2015-11-04 (×7): 15 mL via OROMUCOSAL
  Filled 2015-11-01 (×5): qty 15

## 2015-11-01 MED ORDER — CETYLPYRIDINIUM CHLORIDE 0.05 % MT LIQD
7.0000 mL | Freq: Two times a day (BID) | OROMUCOSAL | Status: DC
  Administered 2015-11-01 – 2015-11-06 (×7): 7 mL via OROMUCOSAL

## 2015-11-01 MED ORDER — DEXTROSE-NACL 5-0.45 % IV SOLN
INTRAVENOUS | Status: DC
  Administered 2015-11-01: 02:00:00 via INTRAVENOUS

## 2015-11-01 MED ORDER — DIPHENHYDRAMINE HCL 25 MG PO CAPS
25.0000 mg | ORAL_CAPSULE | Freq: Every evening | ORAL | Status: DC | PRN
  Administered 2015-11-01: 25 mg via ORAL
  Filled 2015-11-01: qty 1

## 2015-11-01 MED ORDER — BRIMONIDINE TARTRATE 0.2 % OP SOLN
1.0000 [drp] | Freq: Two times a day (BID) | OPHTHALMIC | Status: DC
  Administered 2015-11-01 – 2015-11-07 (×12): 1 [drp] via OPHTHALMIC
  Filled 2015-11-01 (×2): qty 5

## 2015-11-01 MED ORDER — DEXTROSE-NACL 5-0.45 % IV SOLN
INTRAVENOUS | Status: AC
  Administered 2015-11-01: 15:00:00 via INTRAVENOUS

## 2015-11-01 NOTE — Care Management Note (Addendum)
Case Management Note  Patient Details  Name: [name] MRN: [id number] Date of Birth: [birth year]  Subjective/Objective:   CM consult due to home O2. Readmit from 03/27-03/28.  Patient is on chronic O2 through [employer]. She lives at home alone and according to previous admission CM note, she is independent and youthful for her age. Refused home health during last visit  Currently on HFNC at 65%. Attending did not feel she was ready for PT consult yet.                 Action/Plan: Following progression.   Expected Discharge Date:                  Expected Discharge Plan:  [place]  In-House Referral:     Discharge planning Services  CM Consult  Post Acute Care Choice:    Choice offered to:     DME Arranged:    DME Agency:     HH Arranged:    [place] Agency:     Status of Service:  In process, will continue to follow  Medicare Important Message Given:    Date Medicare IM Given:    Medicare IM give by:    Date Additional Medicare IM Given:    Additional Medicare Important Message give by:     If discussed at [place] of Stay Meetings, dates discussed:    Additional Comments:  Jolly Mango, RN 11/01/2015, 3:12 PM

## 2015-11-01 NOTE — Progress Notes (Signed)
Per pharmacy, pt ordered med: brimonidine (ALPHAGAN) 0.2 % ophthalmic solution 1 [phone] Is coming from [facility] today, pharm will send when med arrives.

## 2015-11-01 NOTE — Progress Notes (Signed)
Patient transported to ICU on bipap 10/5, fio2 80%  Patient tol well.  No changes made at this time.

## 2015-11-01 NOTE — Progress Notes (Signed)
Pharmacy Antibiotic Note  [name] is a 82 y.o. female admitted on 10/31/2015 with pneumonia.  Pharmacy has been consulted for cefepime dosing.  This is currently day #2 of antibiotics with vancomycin and cefepime. Vancomycin was discontinued today - MRSA PCR negative.   Plan: Continue cefepime 2 g IV daily  Height: 5\' 2"  (157.5 cm) Weight: 138 lb 10.7 oz (62.9 kg) IBW/kg (Calculated) : 50.1  Temp (24hrs), Avg:99.3 F (37.4 C), Min:97.9 F (36.6 C), Max:100.6 F (38.1 C)   Recent Labs Lab 10/31/15 1845 10/31/15 2138 11/01/15 0412  WBC 6.7  --   --   CREATININE 1.19*  --  1.20*  LATICACIDVEN 2.9* 1.4  --     Estimated Creatinine Clearance: 27.2 mL/min (by C-G formula based on Cr of 1.2).    Allergies  Allergen Reactions  . Lipitor [Atorvastatin] Nausea And Vomiting  . Simvastatin Nausea And Vomiting    Antimicrobials this admission: Vancomycin 4/23 >> 4/24 Cefepime 4/23 >>  Dose adjustments this admission:   Microbiology results: 4/23 BCx: Sent 4/23 UCx: no growth to date  4/23 MRSA PCR: negative  Thank you for allowing pharmacy to be a part of this patient's care.  Lenis Noon, PharmD Clinical Pharmacist 11/01/2015 3:21 PM

## 2015-11-01 NOTE — Progress Notes (Signed)
Spoke with NP Arvil Chaco, verified is ok to transition patient to HFNC 65%40L.

## 2015-11-02 ENCOUNTER — Inpatient Hospital Stay: Payer: Medicare HMO

## 2015-11-02 ENCOUNTER — Encounter: Payer: Self-pay | Admitting: General Practice

## 2015-11-02 DIAGNOSIS — R410 Disorientation, unspecified: Secondary | ICD-10-CM

## 2015-11-02 LAB — CBC
HEMATOCRIT: 36.8 % (ref 35.0–47.0)
Hemoglobin: 11.9 g/dL — ABNORMAL LOW (ref 12.0–16.0)
MCH: 29.8 pg (ref 26.0–34.0)
MCHC: 32.4 g/dL (ref 32.0–36.0)
MCV: 91.9 fL (ref 80.0–100.0)
Platelets: 228 10*3/uL (ref 150–440)
RBC: 4.01 MIL/uL (ref 3.80–5.20)
RDW: 15.9 % — ABNORMAL HIGH (ref 11.5–14.5)
WBC: 8.1 10*3/uL (ref 3.6–11.0)

## 2015-11-02 LAB — BASIC METABOLIC PANEL
Anion gap: 9 (ref 5–15)
BUN: 30 mg/dL — AB (ref 6–20)
CHLORIDE: 105 mmol/L (ref 101–111)
CO2: 28 mmol/L (ref 22–32)
CREATININE: 0.99 mg/dL (ref 0.44–1.00)
Calcium: 8.3 mg/dL — ABNORMAL LOW (ref 8.9–10.3)
GFR calc Af Amer: 56 mL/min — ABNORMAL LOW (ref >60)
GFR, EST NON AFRICAN AMERICAN: 49 mL/min — AB (ref >60)
GLUCOSE: 144 mg/dL — AB (ref 65–99)
POTASSIUM: 4.1 mmol/L (ref 3.5–5.1)
Sodium: 142 mmol/L (ref 135–145)

## 2015-11-02 LAB — TSH: TSH: 0.394 u[IU]/mL (ref 0.350–4.500)

## 2015-11-02 LAB — URINE CULTURE: Culture: NO GROWTH

## 2015-11-02 MED ORDER — DEXMEDETOMIDINE HCL IN NACL 400 MCG/100ML IV SOLN
0.4000 ug/kg/h | INTRAVENOUS | Status: AC
  Administered 2015-11-02: 0.4 ug/kg/h via INTRAVENOUS
  Filled 2015-11-02: qty 100

## 2015-11-02 MED ORDER — DEXTROSE 5 % IV SOLN
2.0000 g | Freq: Two times a day (BID) | INTRAVENOUS | Status: DC
  Administered 2015-11-02 – 2015-11-03 (×2): 2 g via INTRAVENOUS
  Filled 2015-11-02 (×3): qty 2

## 2015-11-02 MED ORDER — TRAZODONE HCL 50 MG PO TABS
25.0000 mg | ORAL_TABLET | Freq: Every evening | ORAL | Status: DC | PRN
  Administered 2015-11-02: 25 mg via ORAL
  Filled 2015-11-02: qty 1

## 2015-11-02 MED ORDER — HALOPERIDOL LACTATE 5 MG/ML IJ SOLN
INTRAMUSCULAR | Status: AC
  Administered 2015-11-02: 2 mg via INTRAVENOUS
  Filled 2015-11-02: qty 1

## 2015-11-02 MED ORDER — HALOPERIDOL LACTATE 5 MG/ML IJ SOLN
5.0000 mg | Freq: Four times a day (QID) | INTRAMUSCULAR | Status: DC | PRN
  Administered 2015-11-03 – 2015-11-04 (×2): 2.5 mg via INTRAVENOUS
  Administered 2015-11-04: 5 mg via INTRAVENOUS
  Filled 2015-11-02 (×2): qty 1

## 2015-11-02 MED ORDER — FUROSEMIDE 10 MG/ML IJ SOLN
40.0000 mg | Freq: Two times a day (BID) | INTRAMUSCULAR | Status: DC
  Administered 2015-11-02 – 2015-11-04 (×5): 40 mg via INTRAVENOUS
  Filled 2015-11-02 (×5): qty 4

## 2015-11-02 MED ORDER — HALOPERIDOL LACTATE 5 MG/ML IJ SOLN
INTRAMUSCULAR | Status: AC
  Administered 2015-11-02: 5 mg
  Filled 2015-11-02: qty 1

## 2015-11-02 MED ORDER — HALOPERIDOL LACTATE 5 MG/ML IJ SOLN
2.0000 mg | Freq: Once | INTRAMUSCULAR | Status: AC
  Administered 2015-11-02: 2 mg via INTRAVENOUS

## 2015-11-02 MED ORDER — METHYLPREDNISOLONE SODIUM SUCC 40 MG IJ SOLR
20.0000 mg | Freq: Two times a day (BID) | INTRAMUSCULAR | Status: DC
  Administered 2015-11-02 – 2015-11-05 (×8): 20 mg via INTRAVENOUS
  Filled 2015-11-02 (×8): qty 1

## 2015-11-02 NOTE — Progress Notes (Signed)
[facility] [place] NAME: [name]    MRN#:  [id number]  DATE OF BIRTH:  [birth year]  SUBJECTIVE:  Hospital Day: 2 days [name] is a 82 y.o. female presenting with Code Sepsis .   Overnight events: agitation overnight requiring haldol Interval Events: agitation this morning now on precedex drip unable to provide meaningful information  REVIEW OF SYSTEMS:  Unable to provide information given mental status medical condition  DRUG ALLERGIES:   Allergies  Allergen Reactions  . Lipitor [Atorvastatin] Nausea And Vomiting  . Simvastatin Nausea And Vomiting    VITALS:  Blood pressure 145/78, pulse 53, temperature 97.2 F (36.2 C), temperature source Core (Comment), resp. rate 16, height 5\' 2"  (1.575 m), weight 62.1 kg (136 lb 14.5 oz), SpO2 95 %.  PHYSICAL EXAMINATION:  VITAL SIGNS: Filed Vitals:   11/02/15 1300 11/02/15 1321  BP: 145/78   Pulse: 54 53  Temp: 96.4 F (35.8 C) 97.2 F (36.2 C)  Resp: 26 16   GENERAL:82 y.o.female currently critically ill on hi-flow nasal canula   HEAD: Normocephalic, atraumatic.  EYES: Pupils equal, round, reactive to light. Extraocular muscles intact. No scleral icterus.  MOUTH: Moist mucosal membrane. Dentition intact. No abscess noted.  EAR, NOSE, THROAT: Clear without exudates. No external lesions.  NECK: Supple. No thyromegaly. No nodules. No JVD.  PULMONARY: diffuse coarse breath sounds with scattered rhonchi without wheeze no use of accessory muscles, Good respiratory effort. good air entry bilaterally CHEST: Nontender to palpation.  CARDIOVASCULAR: S1 and S2. Regular rate and rhythm. No murmurs, rubs, or gallops. No edema. Pedal pulses 2+ bilaterally.  GASTROINTESTINAL: Soft, nontender, nondistended. No masses. Positive bowel sounds. No hepatosplenomegaly.  MUSCULOSKELETAL: No swelling, clubbing, or edema. Range of motion full in all extremities.  NEUROLOGIC: unable to fully  assess given patients mental status  SKIN: No ulceration, lesions, rashes, or cyanosis. Skin warm and dry. Turgor intact.  PSYCHIATRIC: unable to fully assess given patients mental status      LABORATORY PANEL:   CBC  Recent Labs Lab 11/02/15 0353  WBC 8.1  HGB 11.9*  HCT 36.8  PLT 228   ------------------------------------------------------------------------------------------------------------------  Chemistries   Recent Labs Lab 10/31/15 1845 11/01/15 0412 11/02/15 0353  NA 143 142 142  K 4.8 4.6 4.1  CL 101 107 105  CO2 29 26 28   GLUCOSE 114* 141* 144*  BUN 29* 27* 30*  CREATININE 1.19* 1.20* 0.99  CALCIUM 9.1 8.1* 8.3*  MG  --  2.0  --   AST 41  --   --   ALT 23  --   --   ALKPHOS 79  --   --   BILITOT 0.7  --   --    ------------------------------------------------------------------------------------------------------------------  Cardiac Enzymes  Recent Labs Lab 10/31/15 1845  TROPONINI 0.15*   ------------------------------------------------------------------------------------------------------------------  RADIOLOGY:  Dg Chest 1 View  11/02/2015  CLINICAL DATA:  Cough and dyspnea. EXAM: CHEST 1 VIEW COMPARISON:  10/31/2015, 11/01/2015 as well as 08/08/2015 and 06/25/2014. CT 06/25/2014 FINDINGS: The patient slightly rotated to the left. Lungs are adequately inflated demonstrate chronic interstitial prominence over the mid to lower lungs. There is mild patchy airspace opacification over the mid to lower lungs as cannot exclude superimposed infectious process. Possible small amount of bilateral pleural fluid. Stable cardiomegaly. Calcified plaque over the aortic arch. Degenerative changes of the spine. IMPRESSION: Chronic interstitial disease with mild patchy airspace opacification over the mid to lower lungs as cannot  exclude superimposed infection. Possible small amount of bilateral pleural fluid. Stable cardiomegaly. Electronically Signed   By: Marin Olp M.D.   On: 11/02/2015 08:25   Ct Head Wo Contrast  10/31/2015  CLINICAL DATA:  Acute onset altered mental status approximately 15 minutes ago. Initial encounter. EXAM: CT HEAD WITHOUT CONTRAST TECHNIQUE: Contiguous axial images were obtained from the base of the skull through the vertex without intravenous contrast. COMPARISON:  Head CT scan 08/19/2015.  Brain MRI 04/13/2014. FINDINGS: Arachnoid cyst in the left parietal lobe is unchanged. There is extensive chronic microvascular ischemic change. No evidence of acute abnormality including hemorrhage, infarct, mass lesion, midline shift or subdural hemorrhage is identified. The calvarium is intact. Chronic right mastoid effusion is noted. Left mastoid effusion seen on the most recent examination has resolved. IMPRESSION: No acute abnormality. Extensive chronic microvascular ischemic change. No change in an arachnoid cyst in the left parietal lobe. These results were called by telephone at the time of interpretation on 10/31/2015 at 5:56 pm to Dr. Carrie Mew , who verbally acknowledged these results. Electronically Signed   By: Inge Rise M.D.   On: 10/31/2015 18:00   Dg Chest Port 1 View  11/01/2015  CLINICAL DATA:  Respiratory failure. EXAM: PORTABLE CHEST 1 VIEW COMPARISON:  10/31/2015.  06/25/2014. FINDINGS: Mediastinum and hilar structures are stable. Stable cardiomegaly. Stable chronic interstitial changes are present. These changes most consistent chronic interstitial lung disease. A component of active interstitial pneumonitis or edema cannot be entirely excluded . No change from prior exam. Tiny left pleural effusion cannot be excluded. No pneumothorax. IMPRESSION: 1. Stable cardiomegaly. 2. Stable interstitial prominence most likely related to chronic interstitial lung disease. A component of interstitial pneumonitis or edema cannot be entirely excluded. Tiny left pleural effusion cannot be excluded. Electronically Signed   By: [place]   On: 11/01/2015 07:30   Dg Chest Portable 1 View  10/31/2015  CLINICAL DATA:  Wheezing.  Confusion. EXAM: PORTABLE CHEST 1 VIEW COMPARISON:  10/04/2015 chest radiograph. FINDINGS: Stable cardiomediastinal silhouette with mild cardiomegaly. No pneumothorax. No definite pleural effusion. Prominent reticular opacities throughout the mid to lower lungs bilaterally, a chronic finding back to at least 06/25/2014 chest radiograph, not appreciably changed. No acute consolidative airspace disease. IMPRESSION: Stable mild cardiomegaly. Chronic prominent reticular lung opacities in the mid to lower lungs bilaterally, not appreciably changed in the interval. Although a component of mild pulmonary edema cannot be excluded, the findings are more suggestive of a chronic interstitial lung disease. Electronically Signed   By: Ilona Sorrel M.D.   On: 10/31/2015 18:25    EKG:   Orders placed or performed during the hospital encounter of 10/31/15  . ED EKG  . ED EKG  . EKG 12-Lead  . EKG 12-Lead  . EKG 12-Lead  . EKG 12-Lead    ASSESSMENT AND PLAN:   [name] is a 82 y.o. female presenting with Code Sepsis . Admitted 10/31/2015 : Day #: 2 days  1. Acute on chronic respiratory failure with hypoxia: continue hiflo, wean as tolerated, breathing treatment,steroids 2. CAP: cefepime, culture data remains negative - will likely stop antibiotics tomrrow 3. Agitation: question dementia + sundowning + infection +steroids 4.essential hypertension: irbesartan 5. Hypothyroidism unspecified: synthroid   All the records are reviewed and case discussed with Care Management/Social Workerr. Management plans discussed with the patient, family and they are in agreement.  CODE STATUS: dnr TOTAL TIME TAKING CARE OF THIS PATIENT: 33 minutes.   POSSIBLE D/C  IN 3-4DAYS, DEPENDING ON CLINICAL CONDITION.   Angelle Isais,  Karenann Cai.D on 11/02/2015 at 2:26 PM  Between 7am to 6pm - Pager - [phone]  After 6pm: House  Pager: - [facility]  Office  [phone]  CC: Primary care physician; Adin Hector, MD

## 2015-11-02 NOTE — Progress Notes (Addendum)
[facility] Progess Note    ASSESSMENT/PLAN   82 year old female with pulmonary fibrosis presents with acute respiratory failure, possibly from pneumonia. Patient is DO NOT RESUSCITATE.  Addendum: I discussed the case with the patient's son via telephone. He tells me that the patient's oxygen saturations have been low at home for the last few weeks, often in the 80s with 4 L of oxygen. She also often would take her oxygen off in the much lower. Any significant activity, particularly leaving the home would take her a day or several days to recover in terms of her breathing. The patient son notes that she was fairly depressed and that she was pretty much bound to home and can do little else. He also notes that her mental status started to change over the last 1 week with confusion, becoming more frequent. -We discussed the end-stage nature of her disease, we will try to wean down her oxygen requirements over the next 2 days, if she gets to 50% or below on flow oxygen w can hopefully transition her to floor care, if she is not able to get to that point we will likely need to transition her to hospice, he understood and is in agreement with this plan.  PULMONARY A: Acute on chronic hypoxic respiratory failure HCAP End-stage pulmonary fibrosis P:  -Up tolerating BiPAP, continue high flow nasal cannula. -Titrate to HFNC as tolerated -Nebulized steroids and bronchodilators -Empiric antibiotics  -IV steroids. Will decrease, as this may be contributing to her delirium.  Chest x-ray from today reviewed, there is worsening bibasilar atelectasis, superimposed on severe pulmonary fibrosis changes. CT chest December 2015 reviewed, this shows changes consistent with severe pulmonary fibrosis. Groundglass changes likely indicative of an NSIP PATTERN.  Given her advanced age and advanced pulmonary fibrosis, will consult palliative care.   CARDIOVASCULAR A:  Chronic  Afib Hypotension -doubt septic shock Valvular disease P:  -Hemodynamics per ICU protocol -Continue home dose of Eliquis -EKG -Continue diltiazem and avapro  RENAL A:  AKI P:  -Gentle hydration -Monitor I/O -Monitor and replace electrolytes   INFECTIOUS A:  HCAP Doubt sepsis  P:  -Continue empiric abx and f/u cultures.   Micro/culture results: BCx2 4/23: Negative UC 4/23: Negative Sputum-- MRSA screen negative  Antibiotics: Cefepime 4/24>>   ENDOCRINE A:  Hypothyroidism P:  -Continue synthroid -Check TSH  NEUROLOGIC A:  Acute hypoxic , now, complicated by sundowning. Patient was on Ambien at home, can consider restarting this at night. P:  We'll titrate the patient on Precedex,   MAJOR EVENTS/TEST RESULTS:   Best Practices  DVT Prophylaxis: Currently on L, of course GI Prophylaxis: --   ---------------------------------------   ----------------------------------------   Name: WENDELYN CULLIPHER MRN: [id number] DOB: 1924/05/09    ADMISSION DATE:  10/31/2015    SUBJECTIVE:   Pt currently  delirious, can not provide history or review of systems.      VITAL SIGNS: Temp:  [97.3 F (36.3 C)-99 F (37.2 C)] 97.3 F (36.3 C) (04/25 0605) Pulse Rate:  [58-86] 80 (04/25 0605) Resp:  [14-37] 22 (04/25 0605) BP: (132-170)/(72-99) 139/77 mmHg (04/25 0605) SpO2:  [82 %-99 %] 96 % (04/25 0605) FiO2 (%):  [55 %-81 %] 80 % (04/25 0824) Weight:  [136 lb 14.5 oz (62.1 kg)] 136 lb 14.5 oz (62.1 kg) (04/25 0648) HEMODYNAMICS:   VENTILATOR SETTINGS: Vent Mode:  [-]  FiO2 (%):  [55 %-81 %] 80 % INTAKE / OUTPUT:  Intake/Output Summary (Last 24 hours)  at 11/02/15 0938 Last data filed at 11/02/15 0845  Gross per 24 hour  Intake   1080 ml  Output   2270 ml  Net  -1190 ml    PHYSICAL EXAMINATION: Physical Examination:   VS: BP 139/77 mmHg  Pulse 80  Temp(Src) 97.3 F (36.3 C) (Core (Comment))  Resp 22  Ht 5\' 2"  (1.575 m)   Wt 136 lb 14.5 oz (62.1 kg)  BMI 25.03 kg/m2  SpO2 96%  General Appearance: No distress  Neuro:without focal findings, mental status normal. HEENT: PERRLA, EOM intact. Pulmonary: normal breath sounds   CardiovascularNormal S1,S2.  No m/r/g.   Abdomen: Benign, Soft, non-tender. Renal:  No costovertebral tenderness  GU:  Not performed at this time. Endocrine: No evident thyromegaly. Skin:   warm, no rashes, no ecchymosis  Extremities: normal, no cyanosis, clubbing.   LABS:   LABORATORY PANEL:   CBC  Recent Labs Lab 11/02/15 0353  WBC 8.1  HGB 11.9*  HCT 36.8  PLT 228    Chemistries   Recent Labs Lab 10/31/15 1845 11/01/15 0412 11/02/15 0353  NA 143 142 142  K 4.8 4.6 4.1  CL 101 107 105  CO2 29 26 28   GLUCOSE 114* 141* 144*  BUN 29* 27* 30*  CREATININE 1.19* 1.20* 0.99  CALCIUM 9.1 8.1* 8.3*  MG  --  2.0  --   PHOS  --  3.2  --   AST 41  --   --   ALT 23  --   --   ALKPHOS 79  --   --   BILITOT 0.7  --   --      Recent Labs Lab 11/01/15 0142  GLUCAP 117*    Recent Labs Lab 10/31/15 1822 10/31/15 2335 11/01/15 0500  PHART 7.48* 7.38 7.35  PCO2ART 44 50* 53*  PO2ART 53* 161* 147*    Recent Labs Lab 10/31/15 1845  AST 41  ALT 23  ALKPHOS 79  BILITOT 0.7  ALBUMIN 3.4*    Cardiac Enzymes  Recent Labs Lab 10/31/15 1845  TROPONINI 0.15*    RADIOLOGY:  Dg Chest 1 View  11/02/2015  CLINICAL DATA:  Cough and dyspnea. EXAM: CHEST 1 VIEW COMPARISON:  10/31/2015, 11/01/2015 as well as 08/08/2015 and 06/25/2014. CT 06/25/2014 FINDINGS: The patient slightly rotated to the left. Lungs are adequately inflated demonstrate chronic interstitial prominence over the mid to lower lungs. There is mild patchy airspace opacification over the mid to lower lungs as cannot exclude superimposed infectious process. Possible small amount of bilateral pleural fluid. Stable cardiomegaly. Calcified plaque over the aortic arch. Degenerative changes of the spine.  IMPRESSION: Chronic interstitial disease with mild patchy airspace opacification over the mid to lower lungs as cannot exclude superimposed infection. Possible small amount of bilateral pleural fluid. Stable cardiomegaly. Electronically Signed   By: Marin Olp M.D.   On: 11/02/2015 08:25   Ct Head Wo Contrast  10/31/2015  CLINICAL DATA:  Acute onset altered mental status approximately 15 minutes ago. Initial encounter. EXAM: CT HEAD WITHOUT CONTRAST TECHNIQUE: Contiguous axial images were obtained from the base of the skull through the vertex without intravenous contrast. COMPARISON:  Head CT scan 08/19/2015.  Brain MRI 04/13/2014. FINDINGS: Arachnoid cyst in the left parietal lobe is unchanged. There is extensive chronic microvascular ischemic change. No evidence of acute abnormality including hemorrhage, infarct, mass lesion, midline shift or subdural hemorrhage is identified. The calvarium is intact. Chronic right mastoid effusion is noted. Left mastoid effusion seen on  the most recent examination has resolved. IMPRESSION: No acute abnormality. Extensive chronic microvascular ischemic change. No change in an arachnoid cyst in the left parietal lobe. These results were called by telephone at the time of interpretation on 10/31/2015 at 5:56 pm to Dr. Carrie Mew , who verbally acknowledged these results. Electronically Signed   By: Inge Rise M.D.   On: 10/31/2015 18:00   Dg Chest Port 1 View  11/01/2015  CLINICAL DATA:  Respiratory failure. EXAM: PORTABLE CHEST 1 VIEW COMPARISON:  10/31/2015.  06/25/2014. FINDINGS: Mediastinum and hilar structures are stable. Stable cardiomegaly. Stable chronic interstitial changes are present. These changes most consistent chronic interstitial lung disease. A component of active interstitial pneumonitis or edema cannot be entirely excluded . No change from prior exam. Tiny left pleural effusion cannot be excluded. No pneumothorax. IMPRESSION: 1. Stable  cardiomegaly. 2. Stable interstitial prominence most likely related to chronic interstitial lung disease. A component of interstitial pneumonitis or edema cannot be entirely excluded. Tiny left pleural effusion cannot be excluded. Electronically Signed   By: [place]   On: 11/01/2015 07:30   Dg Chest Portable 1 View  10/31/2015  CLINICAL DATA:  Wheezing.  Confusion. EXAM: PORTABLE CHEST 1 VIEW COMPARISON:  10/04/2015 chest radiograph. FINDINGS: Stable cardiomediastinal silhouette with mild cardiomegaly. No pneumothorax. No definite pleural effusion. Prominent reticular opacities throughout the mid to lower lungs bilaterally, a chronic finding back to at least 06/25/2014 chest radiograph, not appreciably changed. No acute consolidative airspace disease. IMPRESSION: Stable mild cardiomegaly. Chronic prominent reticular lung opacities in the mid to lower lungs bilaterally, not appreciably changed in the interval. Although a component of mild pulmonary edema cannot be excluded, the findings are more suggestive of a chronic interstitial lung disease. Electronically Signed   By: Ilona Sorrel M.D.   On: 10/31/2015 18:25       --Marda Stalker, MD.  ICU Pager: [phone] [facility] Office Number: [phone]  Patricia Pesa, M.D.  Vilinda Boehringer, M.D.  Merton Border, M.D  11/02/2015   Critical Care Attestation.  I have personally obtained a history, examined the patient, evaluated laboratory and imaging results, formulated the assessment and plan and placed orders. The Patient requires high complexity decision making for assessment and support, frequent evaluation and titration of therapies, application of advanced monitoring technologies and extensive interpretation of multiple databases. The patient has critical illness that could lead imminently to failure of 1 or more organ systems and requires the highest level of physician preparedness to intervene.  Critical Care  Time devoted to patient care services described in this note is 35 minutes and is exclusive of time spent in procedures.

## 2015-11-02 NOTE — Care Management (Addendum)
Palliative care consult is pending.  Patient with end stage pulmonary fibrosis and on HFNC at 75%.  Spoke with patient's daughter Gaspar Garbe who lives at [facility].  Patient's son [name] has [facility].    Patient has been taking off her oxygen at home and not putting it back on.  It is stated that "non one has said patient has dementia."  "She gets confused when she does not use her oxygen.  Even though she is slow- she ambulates independently and able to perform her adls.  Discussed HFNC oxygen therapy and that this level of oxygen therapy can only be provided in the acute level of care environment.  Patient is sleeping soundly.  She became very confused and agitated during the night and required administration of haldol.

## 2015-11-02 NOTE — Progress Notes (Signed)
Pt was experiencing difficulty sleeping so I spoke with Dr. Jannifer Franklin about getting her home med of Ambien ordered. He was cautious of that and instead ordered Benadryl. The medication had no effect on the pt, therefore I called back for a new order, trazodone. The pt ended up having an adverse reaction to medication, she started hallucinating and became increasingly confused and trying to climb out of bed, yelling and hitting. Hiral, RN paged Dr. Marcille Blanco and received an order for Haldol to calm the pt. Pt is now resting. VS stable, HR has decreased to the 50s, but asymptomatic.

## 2015-11-02 NOTE — Progress Notes (Signed)
Patient now off bipap. Could not tolerate. Patient now on HFNC 80%

## 2015-11-02 NOTE — Plan of Care (Signed)
Problem: Fluid Volume: Goal: Ability to maintain a balanced intake and output will improve Outcome: Progressing MD started patient on lasix IV, patient had fine crackles and infiltrates on chest x-ray. Patient still de-sats with movement so per MD foley catheter kept.  Problem: ICU Phase Progression Outcomes Goal: O2 sats trending toward baseline Outcome: Progressing O2 titrated to 70% HFNC per Respiratory therapy. Goal: Other ICU Phase Outcomes/Goals Outcome: Progressing Patient started on precedex at beginning of shift due to extreme agitation and being combative. Able to titrate off during shift. Patient now much calmer and alert. While still confused for orientation, she is now able to follow commands. Limited PO intake due to mental status (either refusing intake or too confused and unable to follow commands to eat/take medication). MD alerted during rounds, no new orders at that time.

## 2015-11-02 NOTE — Progress Notes (Signed)
Have placed patient on bipap due to desaturation with sleep. Patient is very anxious at this time. RN has given meds in hopes will help patient to rest comfortably

## 2015-11-02 NOTE — Progress Notes (Signed)
Pharmacy Antibiotic Note  CHALISA DELRE is a 82 y.o. female admitted on 10/31/2015 with pneumonia.  Pharmacy has been consulted for cefepime dosing.  This is currently day #3 of antibiotics and on cefepime. Renal function has improved with CrCl of ~33 mL/min.  Plan: Increase dose to cefepime 2 g IV q12h based on renal function and indication.  Height: 5\' 2"  (157.5 cm) Weight: 136 lb 14.5 oz (62.1 kg) IBW/kg (Calculated) : 50.1  Temp (24hrs), Avg:97.8 F (36.6 C), Min:96.4 F (35.8 C), Max:99 F (37.2 C)   Recent Labs Lab 10/31/15 1845 10/31/15 2138 11/01/15 0412 11/02/15 0353  WBC 6.7  --   --  8.1  CREATININE 1.19*  --  1.20* 0.99  LATICACIDVEN 2.9* 1.4  --   --     Estimated Creatinine Clearance: 32.7 mL/min (by C-G formula based on Cr of 0.99).    Allergies  Allergen Reactions  . Lipitor [Atorvastatin] Nausea And Vomiting  . Simvastatin Nausea And Vomiting    Antimicrobials this admission: Vancomycin 4/23 >> 4/24 Cefepime 4/23 >>  Dose adjustments this admission: 4/25 Cefepime changed from daily to q12h  Microbiology results: 4/23 BCx: Sent 4/23 UCx: no growth to date  4/23 MRSA PCR: negative  Thank you for allowing pharmacy to be a part of this patient's care.  Lenis Noon, PharmD Clinical Pharmacist 11/02/2015 2:05 PM

## 2015-11-03 ENCOUNTER — Inpatient Hospital Stay: Payer: Medicare HMO

## 2015-11-03 DIAGNOSIS — J9621 Acute and chronic respiratory failure with hypoxia: Secondary | ICD-10-CM

## 2015-11-03 DIAGNOSIS — I10 Essential (primary) hypertension: Secondary | ICD-10-CM

## 2015-11-03 DIAGNOSIS — Z515 Encounter for palliative care: Secondary | ICD-10-CM

## 2015-11-03 DIAGNOSIS — J8410 Pulmonary fibrosis, unspecified: Secondary | ICD-10-CM

## 2015-11-03 LAB — CBC
HCT: 39.6 % (ref 35.0–47.0)
HEMOGLOBIN: 12.6 g/dL (ref 12.0–16.0)
MCH: 29.7 pg (ref 26.0–34.0)
MCHC: 31.7 g/dL — ABNORMAL LOW (ref 32.0–36.0)
MCV: 93.9 fL (ref 80.0–100.0)
PLATELETS: 255 10*3/uL (ref 150–440)
RBC: 4.22 MIL/uL (ref 3.80–5.20)
RDW: 15.8 % — ABNORMAL HIGH (ref 11.5–14.5)
WBC: 8.7 10*3/uL (ref 3.6–11.0)

## 2015-11-03 LAB — BASIC METABOLIC PANEL
ANION GAP: 10 (ref 5–15)
BUN: 33 mg/dL — ABNORMAL HIGH (ref 6–20)
CHLORIDE: 97 mmol/L — AB (ref 101–111)
CO2: 33 mmol/L — ABNORMAL HIGH (ref 22–32)
Calcium: 8.5 mg/dL — ABNORMAL LOW (ref 8.9–10.3)
Creatinine, Ser: 1.11 mg/dL — ABNORMAL HIGH (ref 0.44–1.00)
GFR calc Af Amer: 49 mL/min — ABNORMAL LOW (ref >60)
GFR, EST NON AFRICAN AMERICAN: 42 mL/min — AB (ref >60)
Glucose, Bld: 115 mg/dL — ABNORMAL HIGH (ref 65–99)
POTASSIUM: 4.5 mmol/L (ref 3.5–5.1)
SODIUM: 140 mmol/L (ref 135–145)

## 2015-11-03 MED ORDER — PNEUMOCOCCAL VAC POLYVALENT 25 MCG/0.5ML IJ INJ
0.5000 mL | INJECTION | INTRAMUSCULAR | Status: AC
  Administered 2015-11-07: 10:00:00 0.5 mL via INTRAMUSCULAR
  Filled 2015-11-03: qty 0.5

## 2015-11-03 MED ORDER — MORPHINE SULFATE (CONCENTRATE) 10 MG/0.5ML PO SOLN
5.0000 mg | ORAL | Status: DC | PRN
  Administered 2015-11-04: 5 mg via SUBLINGUAL
  Filled 2015-11-03 (×2): qty 1

## 2015-11-03 MED ORDER — MORPHINE SULFATE (CONCENTRATE) 10 MG/0.5ML PO SOLN: 5.0000 mg | ORAL | Status: DC | PRN

## 2015-11-03 MED ORDER — PROCHLORPERAZINE 25 MG RE SUPP
25.0000 mg | Freq: Three times a day (TID) | RECTAL | Status: DC | PRN
Start: 2015-11-03 — End: 2015-11-07
  Filled 2015-11-03: qty 1

## 2015-11-03 MED ORDER — DEXTROSE 5 % IV SOLN
2.0000 g | INTRAVENOUS | Status: DC
  Administered 2015-11-04 – 2015-11-05 (×2): 2 g via INTRAVENOUS
  Filled 2015-11-03 (×2): qty 2

## 2015-11-03 MED ORDER — BISACODYL 10 MG RE SUPP: 10.0000 mg | Freq: Every day | RECTAL | Status: DC | PRN

## 2015-11-03 NOTE — Progress Notes (Signed)
[facility] Progess Note    ASSESSMENT/PLAN   82 year old female with pulmonary fibrosis presents with acute respiratory failure, possibly from pneumonia. Patient is DO NOT RESUSCITATE. PULMONARY A: Acute on chronic hypoxic respiratory failure HCAP End-stage pulmonary fibrosis P:  -Up tolerating BiPAP, continue high flow nasal cannula. -Titrate to HFNC as tolerated -Nebulized steroids and bronchodilators -Empiric antibiotics  -IV steroids. Will decrease, as this may be contributing to her delirium.  Chest x-ray from today reviewed, Improving CHF superimposed on severe pulmonary fibrosis changes. CT chest December 2015 reviewed, this shows changes consistent with severe pulmonary fibrosis. Groundglass changes likely indicative of an NSIP PATTERN.  Given her advanced age and advanced pulmonary fibrosis, will consult palliative care.   CARDIOVASCULAR A:  Chronic Afib Hypotension - Valvular disease P:  -Hemodynamics per ICU protocol -Continue home dose of Eliquis -Continue diltiazem and avapro  RENAL A:  AKI P:  -Gentle hydration -Monitor I/O -Monitor and replace electrolytes   INFECTIOUS A:  HCAP Doubt sepsis  P:  -Continue empiric abx and f/u cultures.   Micro/culture results: BCx2 4/23: Negative UC 4/23: Negative Sputum-- MRSA screen negative  Antibiotics: Cefepime 4/24>>   ENDOCRINE A:  Hypothyroidism P:  -Continue synthroid TSH normal  NEUROLOGIC A:  Acute hypoxic , now, complicated by sundowning. Patient was on Ambien at home, can consider restarting this at night. P:  Precedex titrate( currently turned off)   MAJOR EVENTS/TEST RESULTS:   Name: [name] MRN: [id number] DOB: [birth year]    ADMISSION DATE:  10/31/2015  SUBJECTIVE: Patient currently on HFNC 60%, Patient states that she has been feeling much better today.  VITAL SIGNS: Temp:  [96.4 F (35.8 C)-98.6 F (37 C)] 97.9 F  (36.6 C) (04/26 0800) Pulse Rate:  [50-83] 83 (04/26 0800) Resp:  [12-26] 19 (04/26 0800) BP: (131-179)/(71-103) 179/103 mmHg (04/26 0800) SpO2:  [89 %-96 %] 94 % (04/26 0800) FiO2 (%):  [66 %-80 %] 66 % (04/26 0750) HEMODYNAMICS:   VENTILATOR SETTINGS: Vent Mode:  [-]  FiO2 (%):  [66 %-80 %] 66 % INTAKE / OUTPUT:  Intake/Output Summary (Last 24 hours) at 11/03/15 0936 Last data filed at 11/03/15 0500  Gross per 24 hour  Intake  186.6 ml  Output   2775 ml  Net -2588.4 ml    PHYSICAL EXAMINATION: Physical Examination:   VS: BP 179/103 mmHg  Pulse 83  Temp(Src) 97.9 F (36.6 C) (Core (Comment))  Resp 19  Ht 5\' 2"  (1.575 m)  Wt 62.1 kg (136 lb 14.5 oz)  BMI 25.03 kg/m2  SpO2 94%  General Appearance: elderly white female, appears to be in no acute distress.  Neuro:awake, alert ,oriented, follows command. HEENT: Atraumatic, normocephalic, no discharge noted Pulmonary: diminished bases, no wheezes, crackles or rhonchi noted Cardiovascular Normal S1,S2.  No m/r/g.   Abdomen: Benign, Soft, non-tender. Renal:  No costovertebral tenderness  GU:  Not performed at this time. Endocrine: No evident thyromegaly. Skin:   warm, no rashes, no ecchymosis  Extremities: normal, no cyanosis, clubbing.   LABS:   LABORATORY PANEL:   CBC  Recent Labs Lab 11/03/15 0538  WBC 8.7  HGB 12.6  HCT 39.6  PLT 255    Chemistries   Recent Labs Lab 10/31/15 1845 11/01/15 0412  11/03/15 0538  NA 143 142  < > 140  K 4.8 4.6  < > 4.5  CL 101 107  < > 97*  CO2 29 26  < > 33*  GLUCOSE 114* 141*  < >  115*  BUN 29* 27*  < > 33*  CREATININE 1.19* 1.20*  < > 1.11*  CALCIUM 9.1 8.1*  < > 8.5*  MG  --  2.0  --   --   PHOS  --  3.2  --   --   AST 41  --   --   --   ALT 23  --   --   --   ALKPHOS 79  --   --   --   BILITOT 0.7  --   --   --   < > = values in this interval not displayed.   Recent Labs Lab 11/01/15 0142  GLUCAP 117*    Recent Labs Lab 10/31/15 1822  10/31/15 2335 11/01/15 0500  PHART 7.48* 7.38 7.35  PCO2ART 44 50* 53*  PO2ART 53* 161* 147*    Recent Labs Lab 10/31/15 1845  AST 41  ALT 23  ALKPHOS 79  BILITOT 0.7  ALBUMIN 3.4*    Cardiac Enzymes  Recent Labs Lab 10/31/15 1845  TROPONINI 0.15*    RADIOLOGY:  Dg Chest 1 View  11/03/2015  CLINICAL DATA:  Shortness of breath . EXAM: CHEST 1 VIEW COMPARISON:  11/02/2015, 06/25/2014.  CT 06/25/2014 FINDINGS: Stable mediastinal and hilar fullness noted consistent with adenopathy. Similar findings noted on prior CT of 06/25/2014. Hilar structures stable. Persistent cardiomegaly. Interim improvement bilateral interstitial prominence suggesting improvement of congestive heart failure. Underlying chronic interstitial changes are present consistent with chronic interstitial lung disease. Tiny bilateral pleural effusions cannot be excluded. No pneumothorax. IMPRESSION: 1. Mediastinum and hilar fullness again noted consistent with stable mediastinal and hilar adenopathy. Similar findings noted on prior CT chest 06/25/2014. 2. Cardiomegaly with improving interstitial prominence consistent with improving congestive heart failure. Underlying chronic interstitial lung disease is present. Tiny bilateral pleural effusions cannot be excluded. Electronically Signed   By: Marcello Moores  Register   On: 11/03/2015 07:51   Dg Chest 1 View  11/02/2015  CLINICAL DATA:  Cough and dyspnea. EXAM: CHEST 1 VIEW COMPARISON:  10/31/2015, 11/01/2015 as well as 08/08/2015 and 06/25/2014. CT 06/25/2014 FINDINGS: The patient slightly rotated to the left. Lungs are adequately inflated demonstrate chronic interstitial prominence over the mid to lower lungs. There is mild patchy airspace opacification over the mid to lower lungs as cannot exclude superimposed infectious process. Possible small amount of bilateral pleural fluid. Stable cardiomegaly. Calcified plaque over the aortic arch. Degenerative changes of the spine.  IMPRESSION: Chronic interstitial disease with mild patchy airspace opacification over the mid to lower lungs as cannot exclude superimposed infection. Possible small amount of bilateral pleural fluid. Stable cardiomegaly. Electronically Signed   By: Marin Olp M.D.   On: 11/02/2015 08:25         Vandalia Varughese,AG-ACNP Pulmonary & Critical Care Pt seen and examined with NP, please see my note.   Marda Stalker, M.D.  11/03/2015

## 2015-11-03 NOTE — Progress Notes (Addendum)
[place] at [facility]                                                                                                                                                                                            Patient Demographics   [name], is a 82 y.o. female, DOB - 11/15/1923, [id number]  Admit date - 10/31/2015   Admitting Physician Flora Lipps, MD  Outpatient Primary MD for the patient is BERT Briscoe Burns III, MD   LOS - 3  Subjective:pt  Confused wants to go home, still on high flow oxygen therpay     Review of Systems:   CONSTITUTIONAL:  She confused  Vitals:   Filed Vitals:   11/03/15 1000 11/03/15 1100 11/03/15 1144 11/03/15 1200  BP: 131/81 137/76  127/76  Pulse: 101 80  68  Temp: 98.8 F (37.1 C) 98.8 F (37.1 C)  99.1 F (37.3 C)  TempSrc:      Resp: 37 17  21  Height:      Weight:      SpO2: 90% 90% 90% 92%    Wt Readings from Last 3 Encounters:  11/02/15 62.1 kg (136 lb 14.5 oz)  10/08/15 61.236 kg (135 lb)  10/05/15 61.145 kg (134 lb 12.8 oz)     Intake/Output Summary (Last 24 hours) at 11/03/15 1338 Last data filed at 11/03/15 1200  Gross per 24 hour  Intake 329.45 ml  Output   2525 ml  Net -2195.55 ml    Physical Exam:   GENERAL: Pleasant-appearing in no apparent distress.  HEAD, EYES, EARS, NOSE AND THROAT: Atraumatic, normocephalic. Extraocular muscles are intact. Pupils equal and reactive to light. Sclerae anicteric. No conjunctival injection. No oro-pharyngeal erythema.  NECK: Supple. There is no jugular venous distention. No bruits, no lymphadenopathy, no thyromegaly.  HEART: Regular rate and rhythm,. No murmurs, no rubs, no clicks.  LUNGS: Clear to auscultation bilaterally. No rales or rhonchi. No wheezes.  ABDOMEN: Soft, flat, nontender, nondistended. Has good bowel sounds. No hepatosplenomegaly appreciated.  EXTREMITIES: No evidence of any cyanosis, clubbing, or peripheral edema.  +2 pedal and  radial pulses bilaterally.  NEUROLOGIC: The patient is alert, awake, and oriented x3 with no focal motor or sensory deficits appreciated bilaterally.  SKIN: Moist and warm with no rashes appreciated.  Psych: Not anxious, depressed LN: No inguinal LN enlargement    Antibiotics   Anti-infectives    Start     Dose/Rate Route Frequency Ordered Stop   11/02/15 1600  ceFEPIme (MAXIPIME) 2 g in dextrose 5 % 50 mL IVPB     2 g  100 mL/hr over 30 Minutes Intravenous Every 12 hours 11/02/15 1404     11/01/15 1800  ceFEPIme (MAXIPIME) 2 g in dextrose 5 % 50 mL IVPB  Status:  Discontinued     2 g 100 mL/hr over 30 Minutes Intravenous Every 24 hours 10/31/15 2257 11/02/15 1404   11/01/15 1700  vancomycin (VANCOCIN) IVPB 1000 mg/200 mL premix  Status:  Discontinued     1,000 mg 200 mL/hr over 60 Minutes Intravenous Every 36 hours 10/31/15 2257 11/01/15 1321   10/31/15 1815  ceFEPIme (MAXIPIME) 2 g in dextrose 5 % 50 mL IVPB     2 g 100 mL/hr over 30 Minutes Intravenous  Once 10/31/15 1805 10/31/15 1919   10/31/15 1815  vancomycin (VANCOCIN) IVPB 1000 mg/200 mL premix     1,000 mg 200 mL/hr over 60 Minutes Intravenous  Once 10/31/15 1805 10/31/15 1948      Medications   Scheduled Meds: . antiseptic oral rinse  7 mL Mouth Rinse q12n4p  . apixaban  5 mg Oral BID  . timolol  1 drop Both Eyes BID   And  . brimonidine  1 drop Both Eyes BID  . budesonide  0.5 mg Nebulization BID  . ceFEPime (MAXIPIME) IV  2 g Intravenous Q12H  . chlorhexidine  15 mL Mouth Rinse BID  . clobetasol cream  1 application Topical BID  . diltiazem  180 mg Oral Daily  . furosemide  40 mg Intravenous Q12H  . ipratropium-albuterol  3 mL Nebulization Q6H  . irbesartan  37.5 mg Oral Daily  . latanoprost  1 drop Both Eyes QHS  . levothyroxine  50 mcg Oral QAC breakfast  . methylPREDNISolone (SOLU-MEDROL) injection  20 mg Intravenous Q12H   Continuous Infusions: . dexmedetomidine Stopped (11/02/15 1536)   PRN  Meds:.diphenhydrAMINE, haloperidol lactate, ondansetron (ZOFRAN) IV   Data Review:   Micro Results Recent Results (from the past 240 hour(s))  Urine culture     Status: None   Collection Time: 10/31/15  6:45 PM  Result Value Ref Range Status   Specimen Description URINE, RANDOM  Final   Special Requests NONE  Final   Culture NO GROWTH 2 DAYS  Final   Report Status 11/02/2015 FINAL  Final  Blood Culture (routine x 2)     Status: None (Preliminary result)   Collection Time: 10/31/15  6:45 PM  Result Value Ref Range Status   Specimen Description BLOOD RIGHT HAND  Final   Special Requests BOTTLES DRAWN AEROBIC AND ANAEROBIC [place]  Final   Culture NO GROWTH 2 DAYS  Final   Report Status PENDING  Incomplete  Blood Culture (routine x 2)     Status: None (Preliminary result)   Collection Time: 10/31/15  6:50 PM  Result Value Ref Range Status   Specimen Description BLOOD LEFT ARM  Final   Special Requests BOTTLES DRAWN AEROBIC AND ANAEROBIC [place]  Final   Culture NO GROWTH 2 DAYS  Final   Report Status PENDING  Incomplete  MRSA PCR Screening     Status: None   Collection Time: 11/01/15  1:51 AM  Result Value Ref Range Status   MRSA by PCR NEGATIVE NEGATIVE Final    Comment:        The GeneXpert MRSA Assay (FDA approved for NASAL specimens only), is one component of a comprehensive MRSA colonization surveillance program. It is not intended to diagnose MRSA infection nor to guide or monitor treatment for MRSA infections.     Radiology  Reports Dg Chest 1 View  11/03/2015  CLINICAL DATA:  Shortness of breath . EXAM: CHEST 1 VIEW COMPARISON:  11/02/2015, 06/25/2014.  CT 06/25/2014 FINDINGS: Stable mediastinal and hilar fullness noted consistent with adenopathy. Similar findings noted on prior CT of 06/25/2014. Hilar structures stable. Persistent cardiomegaly. Interim improvement bilateral interstitial prominence suggesting improvement of congestive heart failure.  Underlying chronic interstitial changes are present consistent with chronic interstitial lung disease. Tiny bilateral pleural effusions cannot be excluded. No pneumothorax. IMPRESSION: 1. Mediastinum and hilar fullness again noted consistent with stable mediastinal and hilar adenopathy. Similar findings noted on prior CT chest 06/25/2014. 2. Cardiomegaly with improving interstitial prominence consistent with improving congestive heart failure. Underlying chronic interstitial lung disease is present. Tiny bilateral pleural effusions cannot be excluded. Electronically Signed   By: Marcello Moores  Register   On: 11/03/2015 07:51   Dg Chest 1 View  11/02/2015  CLINICAL DATA:  Cough and dyspnea. EXAM: CHEST 1 VIEW COMPARISON:  10/31/2015, 11/01/2015 as well as 08/08/2015 and 06/25/2014. CT 06/25/2014 FINDINGS: The patient slightly rotated to the left. Lungs are adequately inflated demonstrate chronic interstitial prominence over the mid to lower lungs. There is mild patchy airspace opacification over the mid to lower lungs as cannot exclude superimposed infectious process. Possible small amount of bilateral pleural fluid. Stable cardiomegaly. Calcified plaque over the aortic arch. Degenerative changes of the spine. IMPRESSION: Chronic interstitial disease with mild patchy airspace opacification over the mid to lower lungs as cannot exclude superimposed infection. Possible small amount of bilateral pleural fluid. Stable cardiomegaly. Electronically Signed   By: Marin Olp M.D.   On: 11/02/2015 08:25   Ct Head Wo Contrast  10/31/2015  CLINICAL DATA:  Acute onset altered mental status approximately 15 minutes ago. Initial encounter. EXAM: CT HEAD WITHOUT CONTRAST TECHNIQUE: Contiguous axial images were obtained from the base of the skull through the vertex without intravenous contrast. COMPARISON:  Head CT scan 08/19/2015.  Brain MRI 04/13/2014. FINDINGS: Arachnoid cyst in the left parietal lobe is unchanged. There is  extensive chronic microvascular ischemic change. No evidence of acute abnormality including hemorrhage, infarct, mass lesion, midline shift or subdural hemorrhage is identified. The calvarium is intact. Chronic right mastoid effusion is noted. Left mastoid effusion seen on the most recent examination has resolved. IMPRESSION: No acute abnormality. Extensive chronic microvascular ischemic change. No change in an arachnoid cyst in the left parietal lobe. These results were called by telephone at the time of interpretation on 10/31/2015 at 5:56 pm to Dr. Carrie Mew , who verbally acknowledged these results. Electronically Signed   By: Inge Rise M.D.   On: 10/31/2015 18:00   Dg Chest Port 1 View  11/01/2015  CLINICAL DATA:  Respiratory failure. EXAM: PORTABLE CHEST 1 VIEW COMPARISON:  10/31/2015.  06/25/2014. FINDINGS: Mediastinum and hilar structures are stable. Stable cardiomegaly. Stable chronic interstitial changes are present. These changes most consistent chronic interstitial lung disease. A component of active interstitial pneumonitis or edema cannot be entirely excluded . No change from prior exam. Tiny left pleural effusion cannot be excluded. No pneumothorax. IMPRESSION: 1. Stable cardiomegaly. 2. Stable interstitial prominence most likely related to chronic interstitial lung disease. A component of interstitial pneumonitis or edema cannot be entirely excluded. Tiny left pleural effusion cannot be excluded. Electronically Signed   By: [place]   On: 11/01/2015 07:30   Dg Chest Portable 1 View  10/31/2015  CLINICAL DATA:  Wheezing.  Confusion. EXAM: PORTABLE CHEST 1 VIEW COMPARISON:  10/04/2015 chest radiograph. FINDINGS: Stable  cardiomediastinal silhouette with mild cardiomegaly. No pneumothorax. No definite pleural effusion. Prominent reticular opacities throughout the mid to lower lungs bilaterally, a chronic finding back to at least 06/25/2014 chest radiograph, not appreciably  changed. No acute consolidative airspace disease. IMPRESSION: Stable mild cardiomegaly. Chronic prominent reticular lung opacities in the mid to lower lungs bilaterally, not appreciably changed in the interval. Although a component of mild pulmonary edema cannot be excluded, the findings are more suggestive of a chronic interstitial lung disease. Electronically Signed   By: Ilona Sorrel M.D.   On: 10/31/2015 18:25   Portable Chest 1 View  10/04/2015  CLINICAL DATA:  Shortness of breath. EXAM: PORTABLE CHEST 1 VIEW COMPARISON:  August 22, 2015. FINDINGS: Stable cardiomegaly. Stable interstitial densities noted throughout both lungs consistent with pulmonary fibrosis, although superimposed edema or inflammation cannot be excluded. No pneumothorax or pleural effusion is noted. Bony thorax is unremarkable. IMPRESSION: Stable interstitial densities are noted throughout both lungs consistent with pulmonary fibrosis. Superimposed edema or inflammation cannot be excluded. Electronically Signed   By: Marijo Conception, M.D.   On: 10/04/2015 15:42     CBC  Recent Labs Lab 10/31/15 1845 11/02/15 0353 11/03/15 0538  WBC 6.7 8.1 8.7  HGB 12.5 11.9* 12.6  HCT 38.7 36.8 39.6  PLT 227 228 255  MCV 92.7 91.9 93.9  MCH 30.0 29.8 29.7  MCHC 32.3 32.4 31.7*  RDW 16.1* 15.9* 15.8*  LYMPHSABS 0.7*  --   --   MONOABS 0.6  --   --   EOSABS 0.0  --   --   BASOSABS 0.1  --   --     Chemistries   Recent Labs Lab 10/31/15 1845 11/01/15 0412 11/02/15 0353 11/03/15 0538  NA 143 142 142 140  K 4.8 4.6 4.1 4.5  CL 101 107 105 97*  CO2 29 26 28  33*  GLUCOSE 114* 141* 144* 115*  BUN 29* 27* 30* 33*  CREATININE 1.19* 1.20* 0.99 1.11*  CALCIUM 9.1 8.1* 8.3* 8.5*  MG  --  2.0  --   --   AST 41  --   --   --   ALT 23  --   --   --   ALKPHOS 79  --   --   --   BILITOT 0.7  --   --   --     ------------------------------------------------------------------------------------------------------------------ estimated creatinine clearance is 29.2 mL/min (by C-G formula based on Cr of 1.11). ------------------------------------------------------------------------------------------------------------------ No results for input(s): HGBA1C in the last 72 hours. ------------------------------------------------------------------------------------------------------------------ No results for input(s): CHOL, HDL, LDLCALC, TRIG, CHOLHDL, LDLDIRECT in the last 72 hours. ------------------------------------------------------------------------------------------------------------------  Recent Labs  11/02/15 0353  TSH 0.394   ------------------------------------------------------------------------------------------------------------------ No results for input(s): VITAMINB12, FOLATE, FERRITIN, TIBC, IRON, RETICCTPCT in the last 72 hours.  Coagulation profile  Recent Labs Lab 10/31/15 1845  INR 1.16    No results for input(s): DDIMER in the last 72 hours.  Cardiac Enzymes  Recent Labs Lab 10/31/15 1845  TROPONINI 0.15*   ------------------------------------------------------------------------------------------------------------------ Invalid input(s): POCBNP    Assessment & Plan   [name] is a 82 y.o. female presenting with Code Sepsis  1. Acute on chronic respiratory failure with hypoxia continue high flow oxygen. This is related to community-acquired pneumonia continue IV antibiotics. Slow to improve   2. CAP : Continue antibiotics as above  3. Acute delirium likely related to well admission hospitalization supportive care continue Precedex drip  4. Essential hypertension continue irbersartan  5. Hypothyroidism continue  Synthroid       Code Status Orders        Start     Ordered   10/31/15 2245  Do not attempt resuscitation (DNR)   Continuous     Question Answer Comment  In the event of cardiac or respiratory ARREST Do not call a "code blue"   In the event of cardiac or respiratory ARREST Do not perform Intubation, CPR, defibrillation or ACLS   In the event of cardiac or respiratory ARREST Use medication by any route, position, wound care, and other measures to relive pain and suffering. May use oxygen, suction and manual treatment of airway obstruction as needed for comfort.      10/31/15 2244    Code Status History    Date Active Date Inactive Code Status Order ID Comments User Context   10/04/2015  2:00 PM 10/05/2015  7:49 PM DNR [id number]  Demetrios Loll, MD Inpatient   08/27/2015 10:46 AM 10/04/2015  2:00 PM DNR [id number]  Flora Lipps, MD Outpatient   08/08/2015  3:28 PM 08/11/2015  5:28 PM Full Code [id number]  Gladstone Lighter, MD Inpatient           Consults  pulmoary  DVT Prophylaxis  Continue eliquis  Lab Results  Component Value Date   PLT 255 11/03/2015     Time Spent in minutes 66min critical care time spent  Greater than 50% of time spent in care coordination and counseling patient regarding the condition and plan of care.   Dustin Flock M.D on 11/03/2015 at 1:38 PM  Between 7am to 6pm - Pager - [phone]  After 6pm go to [email or web address] - password [email or web address] [place] [facility]   Office  [phone]

## 2015-11-03 NOTE — Progress Notes (Signed)
Patient has remained oriented mostly throughout the shift and rested well.  She is on high flow [place] at 70% and vital signs have remained stable.  Urine output has been adequate and there have been no complaints of pain.  Will continue to monitor.

## 2015-11-03 NOTE — Consult Note (Signed)
Palliative Medicine Inpatient Consult Note   Name: [name] Date: 11/03/2015 MRN: [id number]  DOB: Feb 23, 1924  Referring Physician: Dustin Flock, MD  Palliative Care consult requested for this 82 y.o. female for goals of medical therapy in patient with acute on chronic respiratory failure.  TODAY'S DISCUSSIONS AND DECISIONS: 1. Pt is DNR  2. Dr Ashby Dawes had spoken with son about getting pts oxygen requirements down if possible.  If not possible, she might have to have inpatient terminal weaning from [facility] Flow--but we do not have the option of having pt go under hospice care while she is here.   (Hospice would only be consulted and get involved if there was a viable discharge plan and there can only be a discharge plan if O2 requirements are down to no higher than 15 LPM/ 35% Hi Flow.  )    3.  I spent over one hour on the phone tonight talking with pt's son, [name]. Many questions were brought up and addressed and a plan was presented and devised during this conversation that the son understands.  That plan is this:   -----I will transfer pt to the floor in the am and provide orders to taper pts hi flow by small increments every 6 hours in the daytime ONLY.  Tonight, she will stay in the ICU at current Hi Flow setting. We will set her O2 sat goal for 80-85% when tapering (tonight, she can run a little higher b/c we won't taper her at night).  It may take two days to taper her down using this gradual approach. We already know that the fast approach has failed here and also that with any activity, her sats drop, so the SLOW taper is what we can do instead of pushing too fast.  -----If pt fails and RT or staff has to go back HIGHER on Hi Flow, then another discussion may need to be held with son about not going back up on Hi Flow once we have gotten down to lower levels. But she hasn't failed yet, and we hope that this isn't an issue.  ----If pt plateaus and can't be tapered down, I would  suggest to just leave her at a level over a couple of days and then try to start tapering again.   -----If pt is able to get down to 15 LPM and 35% (and no higher than either of these settings), then she could go home with HOSPICE. It is too early to ask for a Hospice consult unless and until pt gets down to this level. Son now understands that there IS NO INPATIENT HOSPICE here and that we only consult a hospice organization when there is a discharge plan in place and a particular hospice has been chosen.    ----Son understands that pt will need 24hr/ 7day week family supervision of pt going forward. They had planned to have a granddaughter move in with pt but they were thinking pt could still be alone for short periods. I have made the statement that she is no longer appropriate to be alone at all and that they can augment family attendance in pts home with some paid help if needed. They will likely pull together to be with pt round the clock since pt is defined as being 'quite terminal' --with limited life expectancy. Certainly, she has a high likelihood of not being alive in 6 months, but I think it will be less than that.  She could die here in the hospital  under a TERMINAL WITHDRAWAL OF CARE Approach --there is no inpatient hospice option (we would withdraw hi flow --while starting pt on a morphine drip to manage air hunger).  But, we hope she can go home with Hospice.  Son says she would want to go home and no other place.    ----I would not Rx Benadryl again --very anticholinergic and on the BEERS LIST for elderly pts.  I would try Restoril and if that fails to help with sleep, perhaps Ambien can be restarted.  I would start a low dose of Roxanol to use whenever she has air hunger --if she has this.  See orders.  Pt would need a hospital bed with Hospice.  See orders.  I won't be here for the next two days and son is aware that he will need to direct questions to Dr. Posey Pronto and possibly to the Care Mgr.      CLINICAL NARRATIVE: Pt is a 82 yo woman with end-stage pulmonary fibrosis on home O2 at 4 LPM who was found on the floor by her son and sent to the ED.  She was off her oxygen as O2 was found in the bathroom but she was found down in the bedroom.  She had sats in the 40's on arrival here and BP was in the low 80's.  She was placed on BIPAP and sats improved to the 90's.  CXR showed Interstitial lung dz vs infiltrate on CXR.  She was presumed to have HCAP. Pt had been on nebulizers for chronic bronchitis at home also. She was being followed by Dr Mortimer Fries as an outpatient. Sepsis was presumed and ABX were started.  On 4/24, she was changed to Hi Flow at 65% / 40 L.  She is noted to Desat with sleep.    She had been here overnight on 3/27 for an exacerbation of her resp failure. She had iv steroids, nebbs, and had gotten back on her usual 4 LPM of O2 and was sent home the next day. She declined [facility] health.  She also had some diastolic heart failure treated with a few doses of lasix.  She has chronic Afib which is usually rate controlled and treated with cardizem and Eliquis.    Pt takes Ambien at home but she had benadryl here with no effect and then had trazadone.  Staff though she had a bad effect from the Trazadone, but it is more likely to have been from the Benadryl as it is very anticholinergic and has known effects of confusion on the very elderly.  She was given some Haldol and she was also on a Precedex drip on 4/25.    IMPRESSION Acute on chronic resp flre with hypoxia --due to end stage pulmonary fibrosis Possible pneumonia HTN Valvular Heart Disease ---mod-severe Aortic Stenosis ---mod regurg of Mitral, Tricuspid, and Pulmonary valves ---see echo report below in this note Cervical Disc Disease Hyperlipidemia' Hypothyroidism Skin cancers Psoriasis Confusion/ sundowning/ agitation ---Pt has not been diagnosed with dementia but CT scan shows advanced small vessel dz and pt keeps  taking her O2 off and 'she gets confused when the O2 is off'. CKd stage 3?   REVIEW OF SYSTEMS:  She is an unreliable historian. She has no idea why she is here or what is wrong with her.  Pleasantly confused.  She has been getting more confused over the last few weeks. She is stubborn and has refused to wear her O2 into the bathroom. She won't live anywhere  but at home.  She eats very well.    SPIRITUAL SUPPORT SYSTEM: Yes.  SOCIAL HISTORY:  reports that she has never smoked. She has never used smokeless tobacco. She reports that she drinks alcohol. She reports that she does not use illicit drugs. Son is involved in her life and care.  However, pt was living alone with visits from son.  Daughter, Eber Jones lives at [facility] near [place].  Son, [name] has HCPOA though NO DOCUMENT HAS BEEN PRESENTED.     LEGAL DOCUMENTS:  I completed a portable DNR form and placed it in paper chart.  No other documents are in the paper chart.  CODE STATUS: DNR  PAST MEDICAL HISTORY: Past Medical History  Diagnosis Date  . Chronic cough 02/2011    CXR with increased interstitial markings [facility] pulmonology  . Psoriasis     elbows  . Hypertension   . Arthritis   . Pelvis fracture ([place]) 2012  . History of dizziness   . Valvular heart disease   . Cervical disc disease   . Hx of appendectomy   . Hyperlipidemia   . Cancer ([place])     skin  . Pulmonary fibrosis ([facility])     on 4L home oxygen  . Chronic a-fib ([place])     on Eliquis    PAST SURGICAL HISTORY:  Past Surgical History  Procedure Laterality Date  . Back surgery  2007  . Total abdominal hysterectomy w/ bilateral salpingoophorectomy    . Skin cancer excision    . Cardiac catheterization  2006  . Cataract surgery      ALLERGIES:  is allergic to lipitor and simvastatin.  MEDICATIONS:  Current Facility-Administered Medications  Medication Dose Route Frequency Provider Last Rate Last Dose  . antiseptic oral rinse (CPC / CETYLPYRIDINIUM  CHLORIDE 0.05%) solution 7 mL  7 mL Mouth Rinse q12n4p Mikael Spray, NP   7 mL at 11/02/15 1600  . apixaban (ELIQUIS) tablet 5 mg  5 mg Oral BID Mikael Spray, NP   5 mg at 11/03/15 1008  . timolol (TIMOPTIC) 0.5 % ophthalmic solution 1 drop  1 drop Both Eyes BID Mikael Spray, NP   1 drop at 11/03/15 1009   And  . brimonidine (ALPHAGAN) 0.2 % ophthalmic solution 1 drop  1 drop Both Eyes BID Mikael Spray, NP   1 drop at 11/03/15 1009  . budesonide (PULMICORT) nebulizer solution 0.5 mg  0.5 mg Nebulization BID Mikael Spray, NP   0.5 mg at 11/03/15 0751  . [START ON 11/04/2015] ceFEPIme (MAXIPIME) 2 g in dextrose 5 % 50 mL IVPB  2 g Intravenous Q24H Dustin Flock, MD      . chlorhexidine (PERIDEX) 0.12 % solution 15 mL  15 mL Mouth Rinse BID Mikael Spray, NP   15 mL at 11/03/15 1008  . clobetasol cream (TEMOVATE) [id number] % 1 application  1 application Topical BID Mikael Spray, NP   1 application at 0000000 [id number]  . dexmedetomidine (PRECEDEX) 400 MCG/100ML (4 mcg/mL) infusion  0.4-1.2 mcg/kg/hr Intravenous Titrated Laverle Hobby, MD   Stopped at 11/02/15 1536  . diltiazem (CARDIZEM CD) 24 hr capsule 180 mg  180 mg Oral Daily Mikael Spray, NP   180 mg at 11/03/15 1007  . diphenhydrAMINE (BENADRYL) capsule 25 mg  25 mg Oral QHS PRN Lance Coon, MD   25 mg at 11/01/15 2228  . furosemide (LASIX) injection 40 mg  40 mg Intravenous Q12H Bincy S  Varughese, NP   40 mg at 11/03/15 1350  . haloperidol lactate (HALDOL) injection 5 mg  5 mg Intravenous Q6H PRN Lytle Butte, MD      . ipratropium-albuterol (DUONEB) 0.5-2.5 (3) MG/3ML nebulizer solution 3 mL  3 mL Nebulization Q6H Mikael Spray, NP   3 mL at 11/03/15 1403  . irbesartan (AVAPRO) tablet 37.5 mg  37.5 mg Oral Daily Mikael Spray, NP   37.5 mg at 11/03/15 1007  . latanoprost (XALATAN) 0.005 % ophthalmic solution 1 drop  1 drop Both Eyes QHS Mikael Spray, NP   1 drop at 11/03/15 0223  .  levothyroxine (SYNTHROID, LEVOTHROID) tablet 50 mcg  50 mcg Oral QAC breakfast Mikael Spray, NP   50 mcg at 11/03/15 1008  . methylPREDNISolone sodium succinate (SOLU-MEDROL) 40 mg/mL injection 20 mg  20 mg Intravenous Q12H Laverle Hobby, MD   20 mg at 11/03/15 1008  . ondansetron (ZOFRAN) injection 4 mg  4 mg Intravenous Q6H PRN Mikael Spray, NP      . [START ON 11/04/2015] pneumococcal 23 valent vaccine (PNU-IMMUNE) injection 0.5 mL  0.5 mL Intramuscular Tomorrow-1000 Laverle Hobby, MD        Vital Signs: BP 139/88 mmHg  Pulse 52  Temp(Src) 99.3 F (37.4 C) (Core (Comment))  Resp 12  Ht 5\' 2"  (1.575 m)  Wt 62.1 kg (136 lb 14.5 oz)  BMI 25.03 kg/m2  SpO2 90% Filed Weights   10/31/15 0730 11/01/15 0147 11/02/15 0648  Weight: 72.666 kg (160 lb 3.2 oz) 62.9 kg (138 lb 10.7 oz) 62.1 kg (136 lb 14.5 oz)    Estimated body mass index is 25.03 kg/(m^2) as calculated from the following:   Height as of this encounter: 5\' 2"  (1.575 m).   Weight as of this encounter: 62.1 kg (136 lb 14.5 oz).  PERFORMANCE STATUS (ECOG) : 4 - Bedbound at this time  PHYSICAL EXAM: Up in ICU bed --feeding herself a bit while wearing Hi FLow O2 She is a bit confused NAD on Hi Flow Eating nearly a full meal No JVD or Tm Hrt rrr with irreg beats Lungs decreased BS in bases Abd soft and NT Ext no cyanosis or mottling at this time   LABS: CBC:    Component Value Date/Time   WBC 8.7 11/03/2015 0538   WBC 8.4 06/25/2014 0851   HGB 12.6 11/03/2015 0538   HGB 13.6 06/25/2014 0851   HCT 39.6 11/03/2015 0538   HCT 43.0 06/25/2014 0851   PLT 255 11/03/2015 0538   PLT 221 06/25/2014 0851   MCV 93.9 11/03/2015 0538   MCV 97 06/25/2014 0851   NEUTROABS 5.4 10/31/2015 1845   NEUTROABS 5.7 11/18/2011 0529   LYMPHSABS 0.7* 10/31/2015 1845   LYMPHSABS 1.2 11/18/2011 0529   MONOABS 0.6 10/31/2015 1845   MONOABS 0.4 11/18/2011 0529   EOSABS 0.0 10/31/2015 1845   EOSABS 0.0 11/18/2011  0529   BASOSABS 0.1 10/31/2015 1845   BASOSABS 0.0 11/18/2011 0529   Comprehensive Metabolic Panel:    Component Value Date/Time   NA 140 11/03/2015 0538   NA 141 06/26/2014 0406   K 4.5 11/03/2015 0538   K 4.1 06/26/2014 0406   CL 97* 11/03/2015 0538   CL 101 06/26/2014 0406   CO2 33* 11/03/2015 0538   CO2 32 06/26/2014 0406   BUN 33* 11/03/2015 0538   BUN 18 06/26/2014 0406   CREATININE 1.11* 11/03/2015 0538   CREATININE 0.88 06/26/2014 0406  GLUCOSE 115* 11/03/2015 0538   GLUCOSE 143* 06/26/2014 0406   CALCIUM 8.5* 11/03/2015 0538   CALCIUM 8.6 06/26/2014 0406   AST 41 10/31/2015 1845   AST 21 06/25/2014 0851   ALT 23 10/31/2015 1845   ALT 23 06/25/2014 0851   ALKPHOS 79 10/31/2015 1845   ALKPHOS 97 06/25/2014 0851   BILITOT 0.7 10/31/2015 1845   BILITOT 0.3 06/25/2014 0851   PROT 7.3 10/31/2015 1845   PROT 7.0 06/25/2014 0851   ALBUMIN 3.4* 10/31/2015 1845   ALBUMIN 3.3* 06/25/2014 0851   Echo 10/05/15: - Left ventricle: The cavity size was mildly dilated. Wall  thickness was increased in a pattern of mild LVH. Systolic  function was normal. The estimated ejection fraction was in the  range of 60% to 65%. Wall motion was normal; there were no  regional wall motion abnormalities. - Aortic valve: There was mild regurgitation. Valve area (VTI):  0.98 cm^2. Valve area (Vmax): 0.95 cm^2. Valve area (Vmean): 0.94  cm^2. - Mitral valve: There was moderate regurgitation. Valve area by  pressure half-time: 2.06 cm^2. Valve area by continuity equation  (using LVOT flow): 0.92 cm^2. - Tricuspid valve: There was moderate regurgitation. - Pulmonic valve: There was moderate regurgitation. Impressions: - Normal Overall LVF  Normal Wall motion  EF=60%  Mod/severe AoV stenosis/Sclerosis  Mod TR  Mod MR  Mild mod PR.   More than 50% of the visit was spent in counseling/coordination of care: Yes  Time Spent:  120  minutes

## 2015-11-03 NOTE — Progress Notes (Signed)
Pharmacy Antibiotic Note  [name] is a 82 y.o. female admitted on 10/31/2015 with pneumonia.  Pharmacy has been consulted for cefepime dosing.  This is currently day #4 of antibiotics and on cefepime. SCr back up again today at 1.11.   Plan: Will change cefepime back to 2 g iv q 24 hours due to decreased CrCl. After discussion with Dr. Ashby Dawes, will continue abx for now.    Height: 5\' 2"  (157.5 cm) Weight: 136 lb 14.5 oz (62.1 kg) IBW/kg (Calculated) : 50.1  Temp (24hrs), Avg:98.2 F (36.8 C), Min:97.2 F (36.2 C), Max:99.5 F (37.5 C)   Recent Labs Lab 10/31/15 1845 10/31/15 2138 11/01/15 0412 11/02/15 0353 11/03/15 0538  WBC 6.7  --   --  8.1 8.7  CREATININE 1.19*  --  1.20* 0.99 1.11*  LATICACIDVEN 2.9* 1.4  --   --   --     Estimated Creatinine Clearance: 29.2 mL/min (by C-G formula based on Cr of 1.11).    Allergies  Allergen Reactions  . Lipitor [Atorvastatin] Nausea And Vomiting  . Simvastatin Nausea And Vomiting    Antimicrobials this admission: Vancomycin 4/23 >> 4/24 Cefepime 4/23 >>  Dose adjustments this admission: 4/25 Cefepime changed from daily to q12h 4/26 Cefepime changed back to daily  Microbiology results: 4/23 BCx: NGTD x 2 4/23 UCx: negative  4/23 MRSA PCR: negative  Thank you for allowing pharmacy to be a part of this patient's care.  Ulice Dash D, PharmD Clinical Pharmacist 11/03/2015 2:26 PM

## 2015-11-03 NOTE — Progress Notes (Signed)
[place] [facility] Medicine Progess Note    ASSESSMENT/PLAN   82 year old female with pulmonary fibrosis presents with acute respiratory failure, possibly from pneumonia. Patient is DO NOT RESUSCITATE.  Discussion with pt's son 4/25: I discussed the case with the patient's son via telephone. He tells me that the patient's oxygen saturations have been low at home for the last few weeks, often in the 80s with 4 L of oxygen. She also often would take her oxygen off in the much lower. Any significant activity, particularly leaving the home would take her a day or several days to recover in terms of her breathing. The patient son notes that she was fairly depressed and that she was pretty much bound to home and can do little else. He also notes that her mental status started to change over the last 1 week with confusion, becoming more frequent. -We discussed the end-stage nature of her disease, we will try to wean down her oxygen requirements over the next 2 days, if she gets to 50% or below on flow oxygen w can hopefully transition her to floor care, if she is not able to get to that point we will likely need to transition her to hospice, he understood and is in agreement with this plan.  PULMONARY A: Acute on chronic hypoxic respiratory failure. Patient continues to be dependent on high flow oxygen, she is currently on 66%. HCAP End-stage pulmonary fibrosis P:  -Wean down high flow nasal cannula as tolerated. -Nebulized steroids and bronchodilators -Empiric antibiotics  -IV steroids. Will decrease, as this may be contributing to her delirium. -We'll await palliative care consultation, the patient will likely require transition to hospice, as I do not foresee significant improvement in her respiratory status.  Chest x-ray from today reviewed, there is continued bibasilar atelectasis, superimposed on severe pulmonary fibrosis changes. Pulmonary edema changes are slightly improved. CT  chest December 2015 reviewed, this shows changes consistent with severe pulmonary fibrosis. Groundglass changes likely indicative of an NSIP PATTERN.    CARDIOVASCULAR A:  Chronic Afib Hypotension -doubt septic shock Valvular disease P:  -Hemodynamics per ICU protocol -Continue home dose of Eliquis -EKG -Continue diltiazem and avapro  RENAL A:  AKI P:  -Gentle hydration -Monitor I/O -Monitor and replace electrolytes   INFECTIOUS A:  HCAP Doubt sepsis  P:  -Continue empiric abx and f/u cultures.   Micro/culture results: BCx2 4/23: Negative UC 4/23: Negative Sputum-- MRSA screen negative  Antibiotics: Cefepime 4/24>>   ENDOCRINE A:  Hypothyroidism P:  -Continue synthroid -Check TSH  NEUROLOGIC A:  Sundowning, now improved. Dementia. P:  Will monitor, Precedex as needed.   MAJOR EVENTS/TEST RESULTS:   Best Practices  DVT Prophylaxis: Currently on anticoagulation GI Prophylaxis: --   ---------------------------------------   ----------------------------------------   Name: [name] MRN: [id number] DOB: Dec 31, 1923    ADMISSION DATE:  10/31/2015    SUBJECTIVE:   Patient more awake and alert this morning, no new complaints.  VITAL SIGNS: Temp:  [96.4 F (35.8 C)-98.6 F (37 C)] 97.9 F (36.6 C) (04/26 0800) Pulse Rate:  [50-83] 83 (04/26 0800) Resp:  [12-26] 19 (04/26 0800) BP: (131-179)/(71-103) 179/103 mmHg (04/26 0800) SpO2:  [89 %-96 %] 94 % (04/26 0800) FiO2 (%):  [66 %-80 %] 66 % (04/26 0750) HEMODYNAMICS:   VENTILATOR SETTINGS: Vent Mode:  [-]  FiO2 (%):  [66 %-80 %] 66 % INTAKE / OUTPUT:  Intake/Output Summary (Last 24 hours) at 11/03/15 0944 Last data filed at 11/03/15 0900  Gross per 24 hour  Intake  306.6 ml  Output   2775 ml  Net -2468.4 ml    PHYSICAL EXAMINATION: Physical Examination:   VS: BP 179/103 mmHg  Pulse 83  Temp(Src) 97.9 F (36.6 C) (Core (Comment))  Resp 19  Ht 5'  2" (1.575 m)  Wt 136 lb 14.5 oz (62.1 kg)  BMI 25.03 kg/m2  SpO2 94%  General Appearance: No distress  Neuro:without focal findings, alert and oriented 2, somewhat confused, but improved from yesterday.Marland Kitchen HEENT: PERRLA, EOM intact. Pulmonary: normal breath sounds  , decreased bilaterally with bilateral crepitations. CardiovascularNormal S1,S2.  No m/r/g.   Abdomen: Benign, Soft, non-tender. Renal:  No costovertebral tenderness  GU:  Not performed at this time. Endocrine: No evident thyromegaly. Skin:   warm, no rashes, no ecchymosis  Extremities: normal, no cyanosis, clubbing.   LABS:   LABORATORY PANEL:   CBC  Recent Labs Lab 11/03/15 0538  WBC 8.7  HGB 12.6  HCT 39.6  PLT 255    Chemistries   Recent Labs Lab 10/31/15 1845 11/01/15 0412  11/03/15 0538  NA 143 142  < > 140  K 4.8 4.6  < > 4.5  CL 101 107  < > 97*  CO2 29 26  < > 33*  GLUCOSE 114* 141*  < > 115*  BUN 29* 27*  < > 33*  CREATININE 1.19* 1.20*  < > 1.11*  CALCIUM 9.1 8.1*  < > 8.5*  MG  --  2.0  --   --   PHOS  --  3.2  --   --   AST 41  --   --   --   ALT 23  --   --   --   ALKPHOS 79  --   --   --   BILITOT 0.7  --   --   --   < > = values in this interval not displayed.   Recent Labs Lab 11/01/15 0142  GLUCAP 117*    Recent Labs Lab 10/31/15 1822 10/31/15 2335 11/01/15 0500  PHART 7.48* 7.38 7.35  PCO2ART 44 50* 53*  PO2ART 53* 161* 147*    Recent Labs Lab 10/31/15 1845  AST 41  ALT 23  ALKPHOS 79  BILITOT 0.7  ALBUMIN 3.4*    Cardiac Enzymes  Recent Labs Lab 10/31/15 1845  TROPONINI 0.15*    RADIOLOGY:  Dg Chest 1 View  11/03/2015  CLINICAL DATA:  Shortness of breath . EXAM: CHEST 1 VIEW COMPARISON:  11/02/2015, 06/25/2014.  CT 06/25/2014 FINDINGS: Stable mediastinal and hilar fullness noted consistent with adenopathy. Similar findings noted on prior CT of 06/25/2014. Hilar structures stable. Persistent cardiomegaly. Interim improvement bilateral  interstitial prominence suggesting improvement of congestive heart failure. Underlying chronic interstitial changes are present consistent with chronic interstitial lung disease. Tiny bilateral pleural effusions cannot be excluded. No pneumothorax. IMPRESSION: 1. Mediastinum and hilar fullness again noted consistent with stable mediastinal and hilar adenopathy. Similar findings noted on prior CT chest 06/25/2014. 2. Cardiomegaly with improving interstitial prominence consistent with improving congestive heart failure. Underlying chronic interstitial lung disease is present. Tiny bilateral pleural effusions cannot be excluded. Electronically Signed   By: Marcello Moores  Register   On: 11/03/2015 07:51   Dg Chest 1 View  11/02/2015  CLINICAL DATA:  Cough and dyspnea. EXAM: CHEST 1 VIEW COMPARISON:  10/31/2015, 11/01/2015 as well as 08/08/2015 and 06/25/2014. CT 06/25/2014 FINDINGS: The patient slightly rotated to the left. Lungs are adequately inflated demonstrate  chronic interstitial prominence over the mid to lower lungs. There is mild patchy airspace opacification over the mid to lower lungs as cannot exclude superimposed infectious process. Possible small amount of bilateral pleural fluid. Stable cardiomegaly. Calcified plaque over the aortic arch. Degenerative changes of the spine. IMPRESSION: Chronic interstitial disease with mild patchy airspace opacification over the mid to lower lungs as cannot exclude superimposed infection. Possible small amount of bilateral pleural fluid. Stable cardiomegaly. Electronically Signed   By: Marin Olp M.D.   On: 11/02/2015 08:25       --Marda Stalker, MD.  ICU Pager: [phone] [facility] Office Number: [phone]  Patricia Pesa, M.D.  Vilinda Boehringer, M.D.  Merton Border, M.D  11/03/2015   Critical Care Attestation.  I have personally obtained a history, examined the patient, evaluated laboratory and imaging results, formulated the  assessment and plan and placed orders. The Patient requires high complexity decision making for assessment and support, frequent evaluation and titration of therapies, application of advanced monitoring technologies and extensive interpretation of multiple databases. The patient has critical illness that could lead imminently to failure of 1 or more organ systems and requires the highest level of physician preparedness to intervene.  Critical Care Time devoted to patient care services described in this note is 35 minutes and is exclusive of time spent in procedures.

## 2015-11-04 LAB — BASIC METABOLIC PANEL
Anion gap: 14 (ref 5–15)
BUN: 46 mg/dL — ABNORMAL HIGH (ref 6–20)
CALCIUM: 9 mg/dL (ref 8.9–10.3)
CO2: 33 mmol/L — AB (ref 22–32)
Chloride: 95 mmol/L — ABNORMAL LOW (ref 101–111)
Creatinine, Ser: 1.22 mg/dL — ABNORMAL HIGH (ref 0.44–1.00)
GFR calc Af Amer: 44 mL/min — ABNORMAL LOW (ref >60)
GFR calc non Af Amer: 38 mL/min — ABNORMAL LOW (ref >60)
GLUCOSE: 130 mg/dL — AB (ref 65–99)
Potassium: 4.1 mmol/L (ref 3.5–5.1)
Sodium: 142 mmol/L (ref 135–145)

## 2015-11-04 LAB — CBC
HEMATOCRIT: 43.8 % (ref 35.0–47.0)
Hemoglobin: 14.2 g/dL (ref 12.0–16.0)
MCH: 29.6 pg (ref 26.0–34.0)
MCHC: 32.4 g/dL (ref 32.0–36.0)
MCV: 91.4 fL (ref 80.0–100.0)
Platelets: 294 10*3/uL (ref 150–440)
RBC: 4.79 MIL/uL (ref 3.80–5.20)
RDW: 16 % — AB (ref 11.5–14.5)
WBC: 10.9 10*3/uL (ref 3.6–11.0)

## 2015-11-04 MED ORDER — MORPHINE SULFATE (PF) 2 MG/ML IV SOLN
INTRAVENOUS | Status: AC
  Administered 2015-11-04: 1 mg via INTRAVENOUS
  Filled 2015-11-04: qty 1

## 2015-11-04 MED ORDER — MORPHINE SULFATE (PF) 2 MG/ML IV SOLN
1.0000 mg | Freq: Once | INTRAVENOUS | Status: AC
Start: 2015-11-04 — End: 2015-11-04
  Administered 2015-11-04: 1 mg via INTRAVENOUS

## 2015-11-04 MED ORDER — HYDRALAZINE HCL 20 MG/ML IJ SOLN: 10.0000 mg | Freq: Once | INTRAMUSCULAR | Status: AC

## 2015-11-04 MED ORDER — IPRATROPIUM-ALBUTEROL 0.5-2.5 (3) MG/3ML IN SOLN
3.0000 mL | Freq: Three times a day (TID) | RESPIRATORY_TRACT | Status: DC
  Administered 2015-11-05: 3 mL via RESPIRATORY_TRACT
  Filled 2015-11-04: qty 3

## 2015-11-04 MED ORDER — CETYLPYRIDINIUM CHLORIDE 0.05 % MT LIQD
7.0000 mL | Freq: Two times a day (BID) | OROMUCOSAL | Status: DC
  Administered 2015-11-04 – 2015-11-06 (×4): 7 mL via OROMUCOSAL

## 2015-11-04 MED ORDER — CHLORHEXIDINE GLUCONATE 0.12 % MT SOLN
15.0000 mL | Freq: Two times a day (BID) | OROMUCOSAL | Status: DC
  Administered 2015-11-04 – 2015-11-06 (×6): 15 mL via OROMUCOSAL
  Filled 2015-11-04 (×7): qty 15

## 2015-11-04 MED ORDER — ZOLPIDEM TARTRATE 5 MG PO TABS
5.0000 mg | ORAL_TABLET | Freq: Every evening | ORAL | Status: DC | PRN
  Administered 2015-11-04: 23:00:00 5 mg via ORAL
  Filled 2015-11-04: qty 1

## 2015-11-04 MED ORDER — HYDRALAZINE HCL 20 MG/ML IJ SOLN
INTRAMUSCULAR | Status: AC
  Filled 2015-11-04: qty 1

## 2015-11-04 NOTE — Plan of Care (Signed)
Problem: Fluid Volume: Goal: Ability to maintain a balanced intake and output will improve Outcome: Progressing Lasix 40mg  IV given as ordered. Explained the importance of monitoring intake and output. Verbalized understanding.

## 2015-11-04 NOTE — Progress Notes (Signed)
Report called to Christa, RN on 1C.

## 2015-11-04 NOTE — Progress Notes (Addendum)
PT Cancellation Note  Patient Details Name: [name] MRN: [id number] DOB: 1924-02-13   Cancelled Treatment:    Reason Eval/Treat Not Completed: Patient declined, no reason specified.  Pt requesting to hold PT until tomorrow (pt reports not sleeping well last night and wanting to rest) and pt's daughter in room and in agreement with pt's request.  Will hold PT at this time and re-attempt PT eval at a later date/time.   Raquel Sarna Harjit Leider 11/04/2015, 3:53 PM Leitha Bleak, [place]

## 2015-11-04 NOTE — Progress Notes (Signed)
[place] at [facility]                                                                                                                                                                                            Patient Demographics   [name], is a 82 y.o. female, DOB - [birth year], [id number]  Admit date - 10/31/2015   Admitting Physician Flora Lipps, MD  Outpatient Primary MD for the patient is BERT Briscoe Burns III, MD   LOS - 4  Subjective: Patient continues to be confused but oxygen requirements improved she is currently on 4 L     Review of Systems:   CONSTITUTIONAL:  She confused  and unable to provide review of system  Vitals:   Filed Vitals:   11/04/15 1010 11/04/15 1017 11/04/15 1100 11/04/15 1220  BP:   108/67 125/84  Pulse:  99 84 67  Temp:   98.1 F (36.7 C)   TempSrc:      Resp: 22 17 23 18   Height:      Weight:      SpO2: 94% 97% 96% 93%    Wt Readings from Last 3 Encounters:  11/04/15 59.9 kg (132 lb 0.9 oz)  10/08/15 61.236 kg (135 lb)  10/05/15 61.145 kg (134 lb 12.8 oz)     Intake/Output Summary (Last 24 hours) at 11/04/15 1258 Last data filed at 11/04/15 1152  Gross per 24 hour  Intake    410 ml  Output   2750 ml  Net  -2340 ml    Physical Exam:   GENERAL: No acute distress HEAD, EYES, EARS, NOSE AND THROAT: Atraumatic, normocephalic. Extraocular muscles are intact. Pupils equal and reactive to light. Sclerae anicteric. No conjunctival injection. No oro-pharyngeal erythema.  NECK: Supple. There is no jugular venous distention. No bruits, no lymphadenopathy, no thyromegaly.  HEART: Regular rate and rhythm,. No murmurs, no rubs, no clicks.  LUNGS: Clear to auscultation bilaterally. No rales or rhonchi. No wheezes.  ABDOMEN: Soft, flat, nontender, nondistended. Has good bowel sounds. No hepatosplenomegaly appreciated.  EXTREMITIES: No evidence of any cyanosis, clubbing, or peripheral edema.  +2 pedal and  radial pulses bilaterally.  NEUROLOGIC: The patient is alert, awake, and oriented x3 with no focal motor or sensory deficits appreciated bilaterally.  SKIN: Moist and warm with no rashes appreciated.  Psych: Not anxious, depressed LN: No inguinal LN enlargement    Antibiotics   Anti-infectives    Start     Dose/Rate Route Frequency Ordered Stop   11/04/15 1009  ceFEPIme (MAXIPIME) 2 g in dextrose 5 % 50 mL IVPB  2 g 100 mL/hr over 30 Minutes Intravenous Every 24 hours 11/03/15 1427     11/02/15 1600  ceFEPIme (MAXIPIME) 2 g in dextrose 5 % 50 mL IVPB  Status:  Discontinued     2 g 100 mL/hr over 30 Minutes Intravenous Every 12 hours 11/02/15 1404 11/03/15 1427   11/01/15 1800  ceFEPIme (MAXIPIME) 2 g in dextrose 5 % 50 mL IVPB  Status:  Discontinued     2 g 100 mL/hr over 30 Minutes Intravenous Every 24 hours 10/31/15 2257 11/02/15 1404   11/01/15 1700  vancomycin (VANCOCIN) IVPB 1000 mg/200 mL premix  Status:  Discontinued     1,000 mg 200 mL/hr over 60 Minutes Intravenous Every 36 hours 10/31/15 2257 11/01/15 1321   10/31/15 1815  ceFEPIme (MAXIPIME) 2 g in dextrose 5 % 50 mL IVPB     2 g 100 mL/hr over 30 Minutes Intravenous  Once 10/31/15 1805 10/31/15 1919   10/31/15 1815  vancomycin (VANCOCIN) IVPB 1000 mg/200 mL premix     1,000 mg 200 mL/hr over 60 Minutes Intravenous  Once 10/31/15 1805 10/31/15 1948      Medications   Scheduled Meds: . antiseptic oral rinse  7 mL Mouth Rinse q12n4p  . antiseptic oral rinse  7 mL Mouth Rinse q12n4p  . apixaban  5 mg Oral BID  . timolol  1 drop Both Eyes BID   And  . brimonidine  1 drop Both Eyes BID  . budesonide  0.5 mg Nebulization BID  . ceFEPime (MAXIPIME) IV  2 g Intravenous Q24H  . chlorhexidine  15 mL Mouth Rinse BID  . clobetasol cream  1 application Topical BID  . diltiazem  180 mg Oral Daily  . furosemide  40 mg Intravenous Q12H  . ipratropium-albuterol  3 mL Nebulization Q6H  . irbesartan  37.5 mg Oral Daily  .  latanoprost  1 drop Both Eyes QHS  . levothyroxine  50 mcg Oral QAC breakfast  . methylPREDNISolone (SOLU-MEDROL) injection  20 mg Intravenous Q12H  . pneumococcal 23 valent vaccine  0.5 mL Intramuscular Tomorrow-1000   Continuous Infusions:   PRN Meds:.bisacodyl, haloperidol lactate, morphine CONCENTRATE, ondansetron (ZOFRAN) IV, prochlorperazine   Data Review:   Micro Results Recent Results (from the past 240 hour(s))  Urine culture     Status: None   Collection Time: 10/31/15  6:45 PM  Result Value Ref Range Status   Specimen Description URINE, RANDOM  Final   Special Requests NONE  Final   Culture NO GROWTH 2 DAYS  Final   Report Status 11/02/2015 FINAL  Final  Blood Culture (routine x 2)     Status: None (Preliminary result)   Collection Time: 10/31/15  6:45 PM  Result Value Ref Range Status   Specimen Description BLOOD RIGHT HAND  Final   Special Requests BOTTLES DRAWN AEROBIC AND ANAEROBIC [place]  Final   Culture NO GROWTH 3 DAYS  Final   Report Status PENDING  Incomplete  Blood Culture (routine x 2)     Status: None (Preliminary result)   Collection Time: 10/31/15  6:50 PM  Result Value Ref Range Status   Specimen Description BLOOD LEFT ARM  Final   Special Requests BOTTLES DRAWN AEROBIC AND ANAEROBIC [place]  Final   Culture NO GROWTH 3 DAYS  Final   Report Status PENDING  Incomplete  MRSA PCR Screening     Status: None   Collection Time: 11/01/15  1:51 AM  Result Value Ref Range Status  MRSA by PCR NEGATIVE NEGATIVE Final    Comment:        The GeneXpert MRSA Assay (FDA approved for NASAL specimens only), is one component of a comprehensive MRSA colonization surveillance program. It is not intended to diagnose MRSA infection nor to guide or monitor treatment for MRSA infections.     Radiology Reports Dg Chest 1 View  11/03/2015  CLINICAL DATA:  Shortness of breath . EXAM: CHEST 1 VIEW COMPARISON:  11/02/2015, 06/25/2014.  CT 06/25/2014  FINDINGS: Stable mediastinal and hilar fullness noted consistent with adenopathy. Similar findings noted on prior CT of 06/25/2014. Hilar structures stable. Persistent cardiomegaly. Interim improvement bilateral interstitial prominence suggesting improvement of congestive heart failure. Underlying chronic interstitial changes are present consistent with chronic interstitial lung disease. Tiny bilateral pleural effusions cannot be excluded. No pneumothorax. IMPRESSION: 1. Mediastinum and hilar fullness again noted consistent with stable mediastinal and hilar adenopathy. Similar findings noted on prior CT chest 06/25/2014. 2. Cardiomegaly with improving interstitial prominence consistent with improving congestive heart failure. Underlying chronic interstitial lung disease is present. Tiny bilateral pleural effusions cannot be excluded. Electronically Signed   By: Marcello Moores  Register   On: 11/03/2015 07:51   Dg Chest 1 View  11/02/2015  CLINICAL DATA:  Cough and dyspnea. EXAM: CHEST 1 VIEW COMPARISON:  10/31/2015, 11/01/2015 as well as 08/08/2015 and 06/25/2014. CT 06/25/2014 FINDINGS: The patient slightly rotated to the left. Lungs are adequately inflated demonstrate chronic interstitial prominence over the mid to lower lungs. There is mild patchy airspace opacification over the mid to lower lungs as cannot exclude superimposed infectious process. Possible small amount of bilateral pleural fluid. Stable cardiomegaly. Calcified plaque over the aortic arch. Degenerative changes of the spine. IMPRESSION: Chronic interstitial disease with mild patchy airspace opacification over the mid to lower lungs as cannot exclude superimposed infection. Possible small amount of bilateral pleural fluid. Stable cardiomegaly. Electronically Signed   By: Marin Olp M.D.   On: 11/02/2015 08:25   Ct Head Wo Contrast  10/31/2015  CLINICAL DATA:  Acute onset altered mental status approximately 15 minutes ago. Initial encounter. EXAM:  CT HEAD WITHOUT CONTRAST TECHNIQUE: Contiguous axial images were obtained from the base of the skull through the vertex without intravenous contrast. COMPARISON:  Head CT scan 08/19/2015.  Brain MRI 04/13/2014. FINDINGS: Arachnoid cyst in the left parietal lobe is unchanged. There is extensive chronic microvascular ischemic change. No evidence of acute abnormality including hemorrhage, infarct, mass lesion, midline shift or subdural hemorrhage is identified. The calvarium is intact. Chronic right mastoid effusion is noted. Left mastoid effusion seen on the most recent examination has resolved. IMPRESSION: No acute abnormality. Extensive chronic microvascular ischemic change. No change in an arachnoid cyst in the left parietal lobe. These results were called by telephone at the time of interpretation on 10/31/2015 at 5:56 pm to Dr. Carrie Mew , who verbally acknowledged these results. Electronically Signed   By: Inge Rise M.D.   On: 10/31/2015 18:00   Dg Chest Port 1 View  11/01/2015  CLINICAL DATA:  Respiratory failure. EXAM: PORTABLE CHEST 1 VIEW COMPARISON:  10/31/2015.  06/25/2014. FINDINGS: Mediastinum and hilar structures are stable. Stable cardiomegaly. Stable chronic interstitial changes are present. These changes most consistent chronic interstitial lung disease. A component of active interstitial pneumonitis or edema cannot be entirely excluded . No change from prior exam. Tiny left pleural effusion cannot be excluded. No pneumothorax. IMPRESSION: 1. Stable cardiomegaly. 2. Stable interstitial prominence most likely related to chronic interstitial lung disease.  A component of interstitial pneumonitis or edema cannot be entirely excluded. Tiny left pleural effusion cannot be excluded. Electronically Signed   By: [place]   On: 11/01/2015 07:30   Dg Chest Portable 1 View  10/31/2015  CLINICAL DATA:  Wheezing.  Confusion. EXAM: PORTABLE CHEST 1 VIEW COMPARISON:  10/04/2015 chest  radiograph. FINDINGS: Stable cardiomediastinal silhouette with mild cardiomegaly. No pneumothorax. No definite pleural effusion. Prominent reticular opacities throughout the mid to lower lungs bilaterally, a chronic finding back to at least 06/25/2014 chest radiograph, not appreciably changed. No acute consolidative airspace disease. IMPRESSION: Stable mild cardiomegaly. Chronic prominent reticular lung opacities in the mid to lower lungs bilaterally, not appreciably changed in the interval. Although a component of mild pulmonary edema cannot be excluded, the findings are more suggestive of a chronic interstitial lung disease. Electronically Signed   By: Ilona Sorrel M.D.   On: 10/31/2015 18:25     CBC  Recent Labs Lab 10/31/15 1845 11/02/15 0353 11/03/15 0538 11/04/15 0552  WBC 6.7 8.1 8.7 10.9  HGB 12.5 11.9* 12.6 14.2  HCT 38.7 36.8 39.6 43.8  PLT 227 228 255 294  MCV 92.7 91.9 93.9 91.4  MCH 30.0 29.8 29.7 29.6  MCHC 32.3 32.4 31.7* 32.4  RDW 16.1* 15.9* 15.8* 16.0*  LYMPHSABS 0.7*  --   --   --   MONOABS 0.6  --   --   --   EOSABS 0.0  --   --   --   BASOSABS 0.1  --   --   --     Chemistries   Recent Labs Lab 10/31/15 1845 11/01/15 0412 11/02/15 0353 11/03/15 0538 11/04/15 0552  NA 143 142 142 140 142  K 4.8 4.6 4.1 4.5 4.1  CL 101 107 105 97* 95*  CO2 29 26 28  33* 33*  GLUCOSE 114* 141* 144* 115* 130*  BUN 29* 27* 30* 33* 46*  CREATININE 1.19* 1.20* 0.99 1.11* 1.22*  CALCIUM 9.1 8.1* 8.3* 8.5* 9.0  MG  --  2.0  --   --   --   AST 41  --   --   --   --   ALT 23  --   --   --   --   ALKPHOS 79  --   --   --   --   BILITOT 0.7  --   --   --   --    ------------------------------------------------------------------------------------------------------------------ estimated creatinine clearance is 24.2 mL/min (by C-G formula based on Cr of 1.22). ------------------------------------------------------------------------------------------------------------------ No  results for input(s): HGBA1C in the last 72 hours. ------------------------------------------------------------------------------------------------------------------ No results for input(s): CHOL, HDL, LDLCALC, TRIG, CHOLHDL, LDLDIRECT in the last 72 hours. ------------------------------------------------------------------------------------------------------------------  Recent Labs  11/02/15 0353  TSH 0.394   ------------------------------------------------------------------------------------------------------------------ No results for input(s): VITAMINB12, FOLATE, FERRITIN, TIBC, IRON, RETICCTPCT in the last 72 hours.  Coagulation profile  Recent Labs Lab 10/31/15 1845  INR 1.16    No results for input(s): DDIMER in the last 72 hours.  Cardiac Enzymes  Recent Labs Lab 10/31/15 1845  TROPONINI 0.15*   ------------------------------------------------------------------------------------------------------------------ Invalid input(s): POCBNP    Assessment & Plan   [name] is a 82 y.o. female presenting with Code Sepsis  1. Acute on chronic respiratory failure with hypoxia  Respiratory status improved currently on 4 L Continue antibiotics we will change to oral tomorrow  2. Community-acquired pneumonia continue antibiotics oral tomorrow  3. Acute delirium likely related to well admission hospitalization supportive care  We will use when necessary medications for agitation  4. Essential hypertension continue irbersartan  5. Hypothyroidism continue Synthroid  Patient will be transferred to the floor     Code Status Orders        Start     Ordered   10/31/15 2245  Do not attempt resuscitation (DNR)   Continuous    Question Answer Comment  In the event of cardiac or respiratory ARREST Do not call a "code blue"   In the event of cardiac or respiratory ARREST Do not perform Intubation, CPR, defibrillation or ACLS   In the event of cardiac or respiratory  ARREST Use medication by any route, position, wound care, and other measures to relive pain and suffering. May use oxygen, suction and manual treatment of airway obstruction as needed for comfort.      10/31/15 2244    Code Status History    Date Active Date Inactive Code Status Order ID Comments User Context   10/04/2015  2:00 PM 10/05/2015  7:49 PM DNR [id number]  Demetrios Loll, MD Inpatient   08/27/2015 10:46 AM 10/04/2015  2:00 PM DNR [id number]  Flora Lipps, MD Outpatient   08/08/2015  3:28 PM 08/11/2015  5:28 PM Full Code [id number]  Gladstone Lighter, MD Inpatient           Consults  pulmoary  DVT Prophylaxis  Continue eliquis  Lab Results  Component Value Date   PLT 294 11/04/2015     Time Spent in minutes 25min   Greater than 50% of time spent in care coordination and counseling patient regarding the condition and plan of care.   Dustin Flock M.D on 11/04/2015 at 12:58 PM  Between 7am to 6pm - Pager - [phone]  After 6pm go to [email or web address] - password [email or web address] [place] [facility]   Office  [phone]

## 2015-11-04 NOTE — Progress Notes (Signed)
Patient moved to room 110 by wheelchair with Lovena Le, [place].  Patient moved on 4L nasal cannula. RN spoke with Dr. Posey Pronto prior to transfer and MD gave order to d/c off unit tele monitor. Dr. Ashby Dawes gave order to d/c foley also. Foley removed prior to transfer. Son and daughter with patient when she moved to new room.

## 2015-11-04 NOTE — Progress Notes (Signed)
Patient very confused, getting up out of chair without calling for help.  RN and NT assisted patient back to bed.  Patient thinks she is at home.  RN called patient's son greg and he spoke with patient and able to calm her.  [name] coming to visit.  Patient remains confused but calmer and back in bed at this time.

## 2015-11-04 NOTE — Care Management (Signed)
Patient is now on 4 liters nasal cannula and to transfer out of icu today.  she was confused again over night.  Obtained order for physical therapy.  Palliative care has consulted.  If patient is able to discharge directly home (and if there is adequate in home support)hospice services in the home was discussed with family.

## 2015-11-05 ENCOUNTER — Encounter: Payer: Self-pay | Admitting: Internal Medicine

## 2015-11-05 LAB — CULTURE, BLOOD (ROUTINE X 2)
CULTURE: NO GROWTH
Culture: NO GROWTH

## 2015-11-05 MED ORDER — CEFUROXIME AXETIL 500 MG PO TABS
500.0000 mg | ORAL_TABLET | Freq: Every day | ORAL | Status: DC
  Administered 2015-11-05 – 2015-11-06 (×2): 500 mg via ORAL
  Filled 2015-11-05 (×2): qty 1

## 2015-11-05 MED ORDER — IPRATROPIUM-ALBUTEROL 0.5-2.5 (3) MG/3ML IN SOLN
3.0000 mL | Freq: Two times a day (BID) | RESPIRATORY_TRACT | Status: DC
  Administered 2015-11-05 – 2015-11-07 (×4): 3 mL via RESPIRATORY_TRACT
  Filled 2015-11-05 (×4): qty 3

## 2015-11-05 MED ORDER — APIXABAN 2.5 MG PO TABS
2.5000 mg | ORAL_TABLET | Freq: Two times a day (BID) | ORAL | Status: DC
  Administered 2015-11-05 – 2015-11-07 (×5): 2.5 mg via ORAL
  Filled 2015-11-05 (×5): qty 1

## 2015-11-05 NOTE — Progress Notes (Signed)
[place] [facility]                                                                                                                                                                                            Patient Demographics   [name], is a 82 y.o. female, DOB - 03-Jan-1924, [id number]  Admit date - 10/31/2015   Admitting Physician No admitting provider for patient encounter.  Outpatient Primary MD for the patient is BERT Briscoe Burns III, MD   LOS - 5  Subjective: She has breathing is improved. Very weak. Discussed with patient's son was at the bedside.    Review of Systems:   CONSTITUTIONAL:  She confused  and unable to provide review of system  Vitals:   Filed Vitals:   11/05/15 0529 11/05/15 0533 11/05/15 0858 11/05/15 1324  BP: 156/101 155/84  101/64  Pulse: 87 84  73  Temp: 97.6 F (36.4 C)   97.7 F (36.5 C)  TempSrc: Oral   Oral  Resp: 18     Height:      Weight:      SpO2: 92%  92% 88%    Wt Readings from Last 3 Encounters:  11/05/15 58.832 kg (129 lb 11.2 oz)  10/08/15 61.236 kg (135 lb)  10/05/15 61.145 kg (134 lb 12.8 oz)     Intake/Output Summary (Last 24 hours) at 11/05/15 1409 Last data filed at 11/05/15 1131  Gross per 24 hour  Intake    530 ml  Output    100 ml  Net    430 ml    Physical Exam:   GENERAL: No acute distress HEAD, EYES, EARS, NOSE AND THROAT: Atraumatic, normocephalic. Extraocular muscles are intact. Pupils equal and reactive to light. Sclerae anicteric. No conjunctival injection. No oro-pharyngeal erythema.  NECK: Supple. There is no jugular venous distention. No bruits, no lymphadenopathy, no thyromegaly.  HEART: Regular rate and rhythm,. No murmurs, no rubs, no clicks.  LUNGS: Clear to auscultation bilaterally. No rales or rhonchi. No wheezes.  ABDOMEN: Soft, flat, nontender, nondistended. Has good bowel sounds. No hepatosplenomegaly appreciated.  EXTREMITIES: No evidence of any  cyanosis, clubbing, or peripheral edema.  +2 pedal and radial pulses bilaterally.  NEUROLOGIC: The patient is alert, awake, and oriented x3 with no focal motor or sensory deficits appreciated bilaterally.  SKIN: Moist and warm with no rashes appreciated.  Psych: Not anxious, depressed LN: No inguinal LN enlargement    Antibiotics   Anti-infectives    Start     Dose/Rate Route Frequency Ordered Stop   11/04/15 1009  ceFEPIme (MAXIPIME) 2  g in dextrose 5 % 50 mL IVPB     2 g 100 mL/hr over 30 Minutes Intravenous Every 24 hours 11/03/15 1427     11/02/15 1600  ceFEPIme (MAXIPIME) 2 g in dextrose 5 % 50 mL IVPB  Status:  Discontinued     2 g 100 mL/hr over 30 Minutes Intravenous Every 12 hours 11/02/15 1404 11/03/15 1427   11/01/15 1800  ceFEPIme (MAXIPIME) 2 g in dextrose 5 % 50 mL IVPB  Status:  Discontinued     2 g 100 mL/hr over 30 Minutes Intravenous Every 24 hours 10/31/15 2257 11/02/15 1404   11/01/15 1700  vancomycin (VANCOCIN) IVPB 1000 mg/200 mL premix  Status:  Discontinued     1,000 mg 200 mL/hr over 60 Minutes Intravenous Every 36 hours 10/31/15 2257 11/01/15 1321   10/31/15 1815  ceFEPIme (MAXIPIME) 2 g in dextrose 5 % 50 mL IVPB     2 g 100 mL/hr over 30 Minutes Intravenous  Once 10/31/15 1805 10/31/15 1919   10/31/15 1815  vancomycin (VANCOCIN) IVPB 1000 mg/200 mL premix     1,000 mg 200 mL/hr over 60 Minutes Intravenous  Once 10/31/15 1805 10/31/15 1948      Medications   Scheduled Meds: . antiseptic oral rinse  7 mL Mouth Rinse q12n4p  . antiseptic oral rinse  7 mL Mouth Rinse q12n4p  . apixaban  2.5 mg Oral BID  . timolol  1 drop Both Eyes BID   And  . brimonidine  1 drop Both Eyes BID  . budesonide  0.5 mg Nebulization BID  . ceFEPime (MAXIPIME) IV  2 g Intravenous Q24H  . chlorhexidine  15 mL Mouth Rinse BID  . clobetasol cream  1 application Topical BID  . diltiazem  180 mg Oral Daily  . ipratropium-albuterol  3 mL Nebulization BID  . irbesartan  37.5  mg Oral Daily  . latanoprost  1 drop Both Eyes QHS  . levothyroxine  50 mcg Oral QAC breakfast  . methylPREDNISolone (SOLU-MEDROL) injection  20 mg Intravenous Q12H  . pneumococcal 23 valent vaccine  0.5 mL Intramuscular Tomorrow-1000   Continuous Infusions:   PRN Meds:.bisacodyl, haloperidol lactate, morphine CONCENTRATE, ondansetron (ZOFRAN) IV, prochlorperazine, zolpidem   Data Review:   Micro Results Recent Results (from the past 240 hour(s))  Urine culture     Status: None   Collection Time: 10/31/15  6:45 PM  Result Value Ref Range Status   Specimen Description URINE, RANDOM  Final   Special Requests NONE  Final   Culture NO GROWTH 2 DAYS  Final   Report Status 11/02/2015 FINAL  Final  Blood Culture (routine x 2)     Status: None   Collection Time: 10/31/15  6:45 PM  Result Value Ref Range Status   Specimen Description BLOOD RIGHT HAND  Final   Special Requests BOTTLES DRAWN AEROBIC AND ANAEROBIC [place]  Final   Culture NO GROWTH 5 DAYS  Final   Report Status 11/05/2015 FINAL  Final  Blood Culture (routine x 2)     Status: None   Collection Time: 10/31/15  6:50 PM  Result Value Ref Range Status   Specimen Description BLOOD LEFT ARM  Final   Special Requests BOTTLES DRAWN AEROBIC AND ANAEROBIC [place]  Final   Culture NO GROWTH 5 DAYS  Final   Report Status 11/05/2015 FINAL  Final  MRSA PCR Screening     Status: None   Collection Time: 11/01/15  1:51 AM  Result Value  Ref Range Status   MRSA by PCR NEGATIVE NEGATIVE Final    Comment:        The GeneXpert MRSA Assay (FDA approved for NASAL specimens only), is one component of a comprehensive MRSA colonization surveillance program. It is not intended to diagnose MRSA infection nor to guide or monitor treatment for MRSA infections.     Radiology Reports Dg Chest 1 View  11/03/2015  CLINICAL DATA:  Shortness of breath . EXAM: CHEST 1 VIEW COMPARISON:  11/02/2015, 06/25/2014.  CT 06/25/2014  FINDINGS: Stable mediastinal and hilar fullness noted consistent with adenopathy. Similar findings noted on prior CT of 06/25/2014. Hilar structures stable. Persistent cardiomegaly. Interim improvement bilateral interstitial prominence suggesting improvement of congestive heart failure. Underlying chronic interstitial changes are present consistent with chronic interstitial lung disease. Tiny bilateral pleural effusions cannot be excluded. No pneumothorax. IMPRESSION: 1. Mediastinum and hilar fullness again noted consistent with stable mediastinal and hilar adenopathy. Similar findings noted on prior CT chest 06/25/2014. 2. Cardiomegaly with improving interstitial prominence consistent with improving congestive heart failure. Underlying chronic interstitial lung disease is present. Tiny bilateral pleural effusions cannot be excluded. Electronically Signed   By: Marcello Moores  Register   On: 11/03/2015 07:51   Dg Chest 1 View  11/02/2015  CLINICAL DATA:  Cough and dyspnea. EXAM: CHEST 1 VIEW COMPARISON:  10/31/2015, 11/01/2015 as well as 08/08/2015 and 06/25/2014. CT 06/25/2014 FINDINGS: The patient slightly rotated to the left. Lungs are adequately inflated demonstrate chronic interstitial prominence over the mid to lower lungs. There is mild patchy airspace opacification over the mid to lower lungs as cannot exclude superimposed infectious process. Possible small amount of bilateral pleural fluid. Stable cardiomegaly. Calcified plaque over the aortic arch. Degenerative changes of the spine. IMPRESSION: Chronic interstitial disease with mild patchy airspace opacification over the mid to lower lungs as cannot exclude superimposed infection. Possible small amount of bilateral pleural fluid. Stable cardiomegaly. Electronically Signed   By: Marin Olp M.D.   On: 11/02/2015 08:25   Ct Head Wo Contrast  10/31/2015  CLINICAL DATA:  Acute onset altered mental status approximately 15 minutes ago. Initial encounter. EXAM:  CT HEAD WITHOUT CONTRAST TECHNIQUE: Contiguous axial images were obtained from the base of the skull through the vertex without intravenous contrast. COMPARISON:  Head CT scan 08/19/2015.  Brain MRI 04/13/2014. FINDINGS: Arachnoid cyst in the left parietal lobe is unchanged. There is extensive chronic microvascular ischemic change. No evidence of acute abnormality including hemorrhage, infarct, mass lesion, midline shift or subdural hemorrhage is identified. The calvarium is intact. Chronic right mastoid effusion is noted. Left mastoid effusion seen on the most recent examination has resolved. IMPRESSION: No acute abnormality. Extensive chronic microvascular ischemic change. No change in an arachnoid cyst in the left parietal lobe. These results were called by telephone at the time of interpretation on 10/31/2015 at 5:56 pm to Dr. Carrie Mew , who verbally acknowledged these results. Electronically Signed   By: Inge Rise M.D.   On: 10/31/2015 18:00   Dg Chest Port 1 View  11/01/2015  CLINICAL DATA:  Respiratory failure. EXAM: PORTABLE CHEST 1 VIEW COMPARISON:  10/31/2015.  06/25/2014. FINDINGS: Mediastinum and hilar structures are stable. Stable cardiomegaly. Stable chronic interstitial changes are present. These changes most consistent chronic interstitial lung disease. A component of active interstitial pneumonitis or edema cannot be entirely excluded . No change from prior exam. Tiny left pleural effusion cannot be excluded. No pneumothorax. IMPRESSION: 1. Stable cardiomegaly. 2. Stable interstitial prominence most likely related  to chronic interstitial lung disease. A component of interstitial pneumonitis or edema cannot be entirely excluded. Tiny left pleural effusion cannot be excluded. Electronically Signed   By: [place]   On: 11/01/2015 07:30   Dg Chest Portable 1 View  10/31/2015  CLINICAL DATA:  Wheezing.  Confusion. EXAM: PORTABLE CHEST 1 VIEW COMPARISON:  10/04/2015 chest  radiograph. FINDINGS: Stable cardiomediastinal silhouette with mild cardiomegaly. No pneumothorax. No definite pleural effusion. Prominent reticular opacities throughout the mid to lower lungs bilaterally, a chronic finding back to at least 06/25/2014 chest radiograph, not appreciably changed. No acute consolidative airspace disease. IMPRESSION: Stable mild cardiomegaly. Chronic prominent reticular lung opacities in the mid to lower lungs bilaterally, not appreciably changed in the interval. Although a component of mild pulmonary edema cannot be excluded, the findings are more suggestive of a chronic interstitial lung disease. Electronically Signed   By: Ilona Sorrel M.D.   On: 10/31/2015 18:25     CBC  Recent Labs Lab 10/31/15 1845 11/02/15 0353 11/03/15 0538 11/04/15 0552  WBC 6.7 8.1 8.7 10.9  HGB 12.5 11.9* 12.6 14.2  HCT 38.7 36.8 39.6 43.8  PLT 227 228 255 294  MCV 92.7 91.9 93.9 91.4  MCH 30.0 29.8 29.7 29.6  MCHC 32.3 32.4 31.7* 32.4  RDW 16.1* 15.9* 15.8* 16.0*  LYMPHSABS 0.7*  --   --   --   MONOABS 0.6  --   --   --   EOSABS 0.0  --   --   --   BASOSABS 0.1  --   --   --     Chemistries   Recent Labs Lab 10/31/15 1845 11/01/15 0412 11/02/15 0353 11/03/15 0538 11/04/15 0552  NA 143 142 142 140 142  K 4.8 4.6 4.1 4.5 4.1  CL 101 107 105 97* 95*  CO2 29 26 28  33* 33*  GLUCOSE 114* 141* 144* 115* 130*  BUN 29* 27* 30* 33* 46*  CREATININE 1.19* 1.20* 0.99 1.11* 1.22*  CALCIUM 9.1 8.1* 8.3* 8.5* 9.0  MG  --  2.0  --   --   --   AST 41  --   --   --   --   ALT 23  --   --   --   --   ALKPHOS 79  --   --   --   --   BILITOT 0.7  --   --   --   --    ------------------------------------------------------------------------------------------------------------------ estimated creatinine clearance is 24.2 mL/min (by C-G formula based on Cr of 1.22). ------------------------------------------------------------------------------------------------------------------ No  results for input(s): HGBA1C in the last 72 hours. ------------------------------------------------------------------------------------------------------------------ No results for input(s): CHOL, HDL, LDLCALC, TRIG, CHOLHDL, LDLDIRECT in the last 72 hours. ------------------------------------------------------------------------------------------------------------------ No results for input(s): TSH, T4TOTAL, T3FREE, THYROIDAB in the last 72 hours.  Invalid input(s): FREET3 ------------------------------------------------------------------------------------------------------------------ No results for input(s): VITAMINB12, FOLATE, FERRITIN, TIBC, IRON, RETICCTPCT in the last 72 hours.  Coagulation profile  Recent Labs Lab 10/31/15 1845  INR 1.16    No results for input(s): DDIMER in the last 72 hours.  Cardiac Enzymes  Recent Labs Lab 10/31/15 1845  TROPONINI 0.15*   ------------------------------------------------------------------------------------------------------------------ Invalid input(s): POCBNP    Assessment & Plan   [name] is a 82 y.o. female presenting with Code Sepsis  1. Acute on chronic respiratory failure with hypoxia  Respiratory status improved currently on 4 L that is her chronic baseline Change to oral antibiotics Repeat chest x-ray  2. Community-acquired pneumonia continue antibiotics  3. Acute delirium likely related to well admission hospitalization supportive care  Patient's delirium improved  4. Essential hypertension continue irbersartan  5. Hypothyroidism continue Synthroid  Discharge planning home soon discussed with patient's son he does not want her to go to rehabilitation.      Code Status Orders        Start     Ordered   10/31/15 2245  Do not attempt resuscitation (DNR)   Continuous    Question Answer Comment  In the event of cardiac or respiratory ARREST Do not call a "code blue"   In the event of cardiac or  respiratory ARREST Do not perform Intubation, CPR, defibrillation or ACLS   In the event of cardiac or respiratory ARREST Use medication by any route, position, wound care, and other measures to relive pain and suffering. May use oxygen, suction and manual treatment of airway obstruction as needed for comfort.      10/31/15 2244    Code Status History    Date Active Date Inactive Code Status Order ID Comments User Context   10/04/2015  2:00 PM 10/05/2015  7:49 PM DNR [id number]  Demetrios Loll, MD Inpatient   08/27/2015 10:46 AM 10/04/2015  2:00 PM DNR [id number]  Flora Lipps, MD Outpatient   08/08/2015  3:28 PM 08/11/2015  5:28 PM Full Code [id number]  Gladstone Lighter, MD Inpatient           Consults  pulmoary  DVT Prophylaxis  Continue eliquis  Lab Results  Component Value Date   PLT 294 11/04/2015     Time Spent in minutes 29min   Greater than 50% of time spent in care coordination and counseling patient regarding the condition and plan of care.   Dustin Flock M.D on 11/05/2015 at 2:09 PM  Between 7am to 6pm - Pager - [phone]  After 6pm go to [email or web address] - password [email or web address] [facility]   Office  [phone]

## 2015-11-05 NOTE — Progress Notes (Signed)
Pharmacy Note - Anticoagulation  This 82 year old female with pulmonary fibrosis admitted for HCAP has orders for apixaban 5mg  PO BID for atrial fibrillation.  Patient weight: 58.8kg, SCr: 1.22 (trending up from .53), Age: 67  Will adjust for age > 37 and weight < 60 kg to apixaban 2.5mg  PO BID.   Rexene Edison, PharmD Clinical Pharmacist 11/05/2015 8:17 AM

## 2015-11-05 NOTE — Consult Note (Signed)
Ceftin 500 BID with food ordered. Pt crcl is 24.19ml/min. Dose adjustment needed due to renal function. Changed to ceftin 500mg  daily.  Ramond Dial, Pharm.D Clinical Pharmacist

## 2015-11-05 NOTE — Progress Notes (Signed)
Pharmacy Antibiotic Note  [name] is a 82 y.o. female admitted on 10/31/2015 with pneumonia.  Pharmacy has been consulted for cefepime dosing.  This is currently day #6 of antibiotics and on cefepime.   Plan: Will continue cefepime 2gm IV Q24H. Likely to transition to oral antibiotics today.   Height: 5\' 2"  (157.5 cm) Weight: 129 lb 11.2 oz (58.832 kg) IBW/kg (Calculated) : 50.1  Temp (24hrs), Avg:97.9 F (36.6 C), Min:97.5 F (36.4 C), Max:98.2 F (36.8 C)   Recent Labs Lab 10/31/15 1845 10/31/15 2138 11/01/15 0412 11/02/15 0353 11/03/15 0538 11/04/15 0552  WBC 6.7  --   --  8.1 8.7 10.9  CREATININE 1.19*  --  1.20* 0.99 1.11* 1.22*  LATICACIDVEN 2.9* 1.4  --   --   --   --     Estimated Creatinine Clearance: 24.2 mL/min (by C-G formula based on Cr of 1.22).    Allergies  Allergen Reactions  . Lipitor [Atorvastatin] Nausea And Vomiting  . Simvastatin Nausea And Vomiting    Antimicrobials this admission: Vancomycin 4/23 >> 4/24 Cefepime 4/23 >>  Dose adjustments this admission: 4/25 Cefepime changed from daily to q12h 4/26 Cefepime changed back to daily  Microbiology results: 4/23 BCx: NGTD x 2 4/23 UCx: negative  4/23 MRSA PCR: negative  Thank you for allowing pharmacy to be a part of this patient's care.  Cheri Guppy, PharmD Clinical Pharmacist 11/05/2015 8:02 AM

## 2015-11-05 NOTE — Evaluation (Signed)
Physical Therapy Evaluation Patient Details Name: [name] MRN: [id number] DOB: 1924/01/26 Today's Date: 11/05/2015   History of Present Illness  Pt is a 82 y.o. female presenting to hospital after being found on floor by son without O2 on (oxygen sats in 40's) and admitted with acute on chronic hypoxic respiratory failure d/t community acquired PNA.  PMH includes htn, valvular disorder, end-stage pulmonary fibrosis on home O2 (4L), chronic a-fib, bronchitis, pelvic fx.  Clinical Impression  Prior to admission, pt was modified independent ambulating with rollator short distances in home (pt often sat on rollator and pushed herself around with LE's d/t SOB).  Pt lives alone on main level of home with stairs to enter.  Currently pt is CGA with transfers and ambulation 40 feet with RW (limited distance d/t O2 decreasing from 90% at rest to 84% with activity all on 4 L/min O2 via nasal cannula; returned to 90% O2 sats with rest and vc's for pursed lip breathing; nursing notified).  Pt would benefit from skilled PT to address noted impairments and functional limitations.  Pt appears close to her baseline in terms of functional mobility but have concerns regarding h/o falls, h/o taking off her oxygen at home, and safety (pt demonstrating confusion during session); recommend pt discharge to home with 24/7 assist and HHPT when medically appropriate.     Follow Up Recommendations Home health PT;Supervision/Assistance - 24 hour (pt requesting female HHPT)    Equipment Recommendations       Recommendations for Other Services       Precautions / Restrictions Precautions Precautions: Fall Restrictions Weight Bearing Restrictions: No      Mobility  Bed Mobility               General bed mobility comments: Not assessed d/t pt sitting up in chair beginning and end of session.  Transfers Overall transfer level: Needs assistance Equipment used: Rolling walker (2 wheeled) Transfers: Sit  to/from Stand Sit to Stand: Min guard         General transfer comment: strong stand; steady without loss of balance  Ambulation/Gait Ambulation/Gait assistance: Min guard Ambulation Distance (Feet): 40 Feet Assistive device: Rolling walker (2 wheeled) Gait Pattern/deviations: Step-through pattern Gait velocity: mildly decreased   General Gait Details: steady without loss of balance  Stairs            Wheelchair Mobility    Modified Rankin (Stroke Patients Only)       Balance Overall balance assessment: Needs assistance;History of Falls Sitting-balance support: Bilateral upper extremity supported;Feet supported Sitting balance-Leahy Scale: Good     Standing balance support: Bilateral upper extremity supported;During functional activity (on RW) Standing balance-Leahy Scale: Fair                               Pertinent Vitals/Pain Pain Assessment: No/denies pain    Home Living Family/patient expects to be discharged to:: Private residence Living Arrangements: Alone Available Help at Discharge: Family Type of Home: House Home Access: Stairs to enter   [employer] of Steps: 4-5 with R grab bar Home Layout: Two level;Able to live on main level with bedroom/bathroom;Laundry or work area in [place]: Clinical cytogeneticist - 4 wheels;Cane - single point      Prior Function Level of Independence: Independent with assistive device(s)         Comments: Pt reports using rollator at home and pt's daughter reports pt often sits  on rollator seat and pushes herself around with her LE's.  Chronic home O2 4 L/min (but pt's daughter reports pt often takes it off).  Pt's daughter reports pt with at least 3 falls in past 6 months.     Hand Dominance        Extremity/Trunk Assessment   Upper Extremity Assessment: Generalized weakness           Lower Extremity Assessment: Generalized weakness         Communication    Communication: No difficulties  Cognition Arousal/Alertness: Awake/alert Behavior During Therapy: WFL for tasks assessed/performed Overall Cognitive Status: History of cognitive impairments - at baseline (Oriented to person and place but confusion noted during session (pt's daughter able to clarify/correct pt's answers intermittently))       Memory: Decreased recall of precautions;Decreased short-term memory              General Comments   Nursing cleared pt for participation in physical therapy.  Pt agreeable to PT session.    Exercises  Pursed lip breathing ex's x8 minutes during session.      Assessment/Plan    PT Assessment Patient needs continued PT services  PT Diagnosis Difficulty walking;Generalized weakness   PT Problem List Decreased strength;Decreased activity tolerance;Decreased balance;Decreased mobility;Decreased safety awareness;Decreased knowledge of precautions;Cardiopulmonary status limiting activity  PT Treatment Interventions DME instruction;Gait training;Stair training;Functional mobility training;Therapeutic activities;Therapeutic exercise;Balance training;Patient/family education   PT Goals (Current goals can be found in the Care Plan section) Acute Rehab PT Goals Patient Stated Goal: to go home PT Goal Formulation: With patient Time For Goal Achievement: 11/19/15 Potential to Achieve Goals: Fair    Frequency Min 2X/week   Barriers to discharge Decreased caregiver support      Co-evaluation               End of Session Equipment Utilized During Treatment: Gait belt;Oxygen (4 L/min via nasal cannula) Activity Tolerance:  (Limited d/t O2 desaturation with activity) Patient left: in chair;with call bell/phone within reach;with chair alarm set;with family/visitor present Nurse Communication: Mobility status;Precautions (O2 desaturation with activity)         Time: 1435-1510 PT Time Calculation (min) (ACUTE ONLY): 35 min   Charges:   PT  Evaluation $PT Eval Moderate Complexity: 1 Procedure PT Treatments $Therapeutic Exercise: 8-22 mins   PT G CodesLeitha Bleak 09-Nov-2015, 5:24 PM Leitha Bleak, [place]

## 2015-11-05 NOTE — Progress Notes (Signed)
Pt transferred to another nurse at 2300.  Pt sleeping quietly. Dorna Bloom RN

## 2015-11-06 MED ORDER — PREDNISONE 50 MG PO TABS
50.0000 mg | ORAL_TABLET | Freq: Every day | ORAL | Status: DC
  Administered 2015-11-06 – 2015-11-07 (×2): 50 mg via ORAL
  Filled 2015-11-06 (×2): qty 1

## 2015-11-06 NOTE — Progress Notes (Signed)
Patient ID: [name], female   DOB: 03-Sep-1923, 82 y.o.   MRN: [id number] [place] [facility] [place] NAME: [name]    MR#:  [id number]  DATE OF BIRTH:  1924/05/31  SUBJECTIVE:   No complaints. Appears comfortable REVIEW OF SYSTEMS:   Review of Systems  Constitutional: Negative for fever, chills and weight loss.  HENT: Negative for ear discharge, ear pain and nosebleeds.   Eyes: Negative for blurred vision, pain and discharge.  Respiratory: Negative for sputum production, shortness of breath, wheezing and stridor.   Cardiovascular: Negative for chest pain, palpitations, orthopnea and PND.  Gastrointestinal: Negative for nausea, vomiting, abdominal pain and diarrhea.  Genitourinary: Negative for urgency and frequency.  Musculoskeletal: Negative for back pain and joint pain.  Neurological: Negative for sensory change, speech change, focal weakness and weakness.  Psychiatric/Behavioral: Negative for depression and hallucinations. The patient is not nervous/anxious.   All other systems reviewed and are negative.  Tolerating Diet:yes Tolerating PT: HHPT  DRUG ALLERGIES:   Allergies  Allergen Reactions  . Lipitor [Atorvastatin] Nausea And Vomiting  . Simvastatin Nausea And Vomiting    VITALS:  Blood pressure 134/72, pulse 73, temperature 97.7 F (36.5 C), temperature source Oral, resp. rate 20, height 5\' 2"  (1.575 m), weight 58.832 kg (129 lb 11.2 oz), SpO2 93 %.  PHYSICAL EXAMINATION:   Physical Exam  GENERAL:  82 y.o.-year-old patient lying in the bed with no acute distress.  EYES: Pupils equal, round, reactive to light and accommodation. No scleral icterus. Extraocular muscles intact.  HEENT: Head atraumatic, normocephalic. Oropharynx and nasopharynx clear.  NECK:  Supple, no jugular venous distention. No thyroid enlargement, no tenderness.  LUNGS: distant breath sounds bilaterally, no wheezing, rales, rhonchi. No use of  accessory muscles of respiration.  CARDIOVASCULAR: S1, S2 normal. No murmurs, rubs, or gallops.  ABDOMEN: Soft, nontender, nondistended. Bowel sounds present. No organomegaly or mass.  EXTREMITIES: No cyanosis, clubbing or edema b/l.    NEUROLOGIC: Cranial nerves II through XII are intact. No focal Motor or sensory deficits b/l.   PSYCHIATRIC:  patient is alert and oriented x 3.  SKIN: No obvious rash, lesion, or ulcer.   LABORATORY PANEL:  CBC  Recent Labs Lab 11/04/15 0552  WBC 10.9  HGB 14.2  HCT 43.8  PLT 294    Chemistries   Recent Labs Lab 10/31/15 1845 11/01/15 0412  11/04/15 0552  NA 143 142  < > 142  K 4.8 4.6  < > 4.1  CL 101 107  < > 95*  CO2 29 26  < > 33*  GLUCOSE 114* 141*  < > 130*  BUN 29* 27*  < > 46*  CREATININE 1.19* 1.20*  < > 1.22*  CALCIUM 9.1 8.1*  < > 9.0  MG  --  2.0  --   --   AST 41  --   --   --   ALT 23  --   --   --   ALKPHOS 79  --   --   --   BILITOT 0.7  --   --   --   < > = values in this interval not displayed. Cardiac Enzymes  Recent Labs Lab 10/31/15 1845  TROPONINI 0.15*   RADIOLOGY:  No results found. ASSESSMENT AND PLAN:   [name] is a 82 y.o. female presenting with Code Sepsis  1. Acute on chronic respiratory failure with hypoxia  Respiratory status improved currently  on 4 L that is her chronic baseline Change to oral antibiotics Afebrile, wbc normal  2. Community-acquired pneumonia continue antibiotics  3. Acute delirium likely related to well admission hospitalization supportive care  Patient's delirium improved  4. Essential hypertension continue irbersartan  5. Hypothyroidism continue Synthroid  Discharge planning home soon discussed with patient's son he does not want her to go to rehabilitation. Pt appeears at baseline. Son wants to keep pt in the hospital over the weekend and D/W palliative care on Monday. Discussed about HH and CM to arrange home needs however wants to wait till monday Case  discussed with Care Management/Social Worker. Management plans discussed with the patient, family and they are in agreement.  CODE STATUS: DNR  DVT Prophylaxis:lvoenx TOTAL TIME TAKING CARE OF THIS PATIENT: 30* minutes.  >50% time spent on counselling and coordination of care   Note: This dictation was prepared with Dragon dictation along with smaller phrase technology. Any transcriptional errors that result from this process are unintentional.  [name] M.D on 11/06/2015 at 1:59 PM  Between 7am to 6pm - Pager - [phone]  After 6pm go to [email or web address] - password [email or web address] [facility]  [place] [facility]  Office  [phone]  CC: Primary care physician; Adin Hector, MD

## 2015-11-07 DIAGNOSIS — J189 Pneumonia, unspecified organism: Secondary | ICD-10-CM | POA: Diagnosis not present

## 2015-11-07 MED ORDER — PREDNISONE 10 MG PO TABS: ORAL_TABLET | ORAL | Status: AC

## 2015-11-07 MED ORDER — MORPHINE SULFATE (CONCENTRATE) 10 MG/0.5ML PO SOLN: 5.0000 mg | ORAL | Status: AC | PRN

## 2015-11-07 MED ORDER — CEFUROXIME AXETIL 500 MG PO TABS: 500.0000 mg | ORAL_TABLET | Freq: Every day | ORAL | Status: AC

## 2015-11-07 MED ORDER — APIXABAN 2.5 MG PO TABS: 2.5000 mg | ORAL_TABLET | Freq: Two times a day (BID) | ORAL | Status: AC

## 2015-11-07 NOTE — Care Management Note (Signed)
Case Management Note  Patient Details  Name: [name] MRN: [id number] Date of Birth: [birth year]  Subjective/Objective:   Call to [place] [facility] [place] who has no record of a Hospice in-home services for Ms Sobotka. Dr Hale Bogus note and the case managers note allude to hospice services. Call to Dr Fritzi Mandes to clarify whether she wants to discharge Ms Voelz home with Hospice services or if she wants to discharge her with home health services.                   Action/Plan:   Expected Discharge Date:                  Expected Discharge Plan:  [place]  In-House Referral:     Discharge planning Services  CM Consult  Post Acute Care Choice:    Choice offered to:     DME Arranged:    DME Agency:     HH Arranged:    [place] Agency:     Status of Service:  In process, will continue to follow  Medicare Important Message Given:    Date Medicare IM Given:    Medicare IM give by:    Date Additional Medicare IM Given:    Additional Medicare Important Message give by:     If discussed at [place] of Stay Meetings, dates discussed:    Additional Comments:  Numa Schroeter A, RN 11/07/2015, 10:10 AM

## 2015-11-07 NOTE — Care Management Note (Signed)
Case Management Note  Patient Details  Name: [name] MRN: [id number] Date of Birth: 1923/12/24  Subjective/Objective:    Discussed discharge plan with Dr Fritzi Mandes. Dr Posey Pronto is ordering discharge home with hospice services. Mariann Laster at [facility] of A/C updated and a referral for hospice services was faxed to [place].                 Action/Plan:   Expected Discharge Date:                  Expected Discharge Plan:  [place]  In-House Referral:     Discharge planning Services  CM Consult  Post Acute Care Choice:    Choice offered to:     DME Arranged:    DME Agency:     HH Arranged:    [place] Agency:     Status of Service:  In process, will continue to follow  Medicare Important Message Given:    Date Medicare IM Given:    Medicare IM give by:    Date Additional Medicare IM Given:    Additional Medicare Important Message give by:     If discussed at [place] of Stay Meetings, dates discussed:    Additional Comments:  Lavalle Skoda A, RN 11/07/2015, 10:20 AM

## 2015-11-07 NOTE — Discharge Summary (Signed)
[place] at [place] NAME: [name]    MR#:  [id number]  DATE OF BIRTH:  11/13/1923  DATE OF ADMISSION:  10/31/2015 ADMITTING PHYSICIAN: No admitting provider for patient encounter.  DATE OF DISCHARGE: 11/07/15  PRIMARY CARE PHYSICIAN: Tama High III, MD    ADMISSION DIAGNOSIS:  Acute respiratory failure (HCC) [J96.00] Healthcare-associated pneumonia [J18.9] Acute on chronic respiratory failure with hypoxia (HCC) [J96.21] Sepsis, due to unspecified organism ([place]) [A41.9]  DISCHARGE DIAGNOSIS:  Acute on chronic respiratory failure due to Chronic pulmonary fibrosis Chronic home oxygen pneumonia Acute Delirium-resolved SECONDARY DIAGNOSIS:   Past Medical History  Diagnosis Date  . Chronic cough 02/2011    CXR with increased interstitial markings [facility] pulmonology  . Psoriasis     elbows  . Hypertension   . Arthritis   . Pelvis fracture ([place]) 2012  . History of dizziness   . Valvular heart disease   . Cervical disc disease   . Hx of appendectomy   . Hyperlipidemia   . Cancer ([place])     skin  . Pulmonary fibrosis (HCC)     on 4L home oxygen  . Chronic a-fib ([place])     on Eliquis    HOSPITAL COURSE:   [name] is a 82 y.o. female presenting with Code Sepsis  1. Acute on chronic respiratory failure with hypoxia  Respiratory status improved currently on 4 L that is her chronic baseline Change to oral antibiotics Afebrile, wbc normal Morphine for anxitey and sob  2. Community-acquired pneumonia continue antibiotics  3. Acute delirium likely related to well admission hospitalization supportive care  Patient's delirium improved  4. Essential hypertension continue irbersartan  5. Hypothyroidism continue Synthroid  Spoke with pt and she wishes to go home today. Son is ok to pick her up today HHPT/RN to be set up CONSULTS OBTAINED:     DRUG ALLERGIES:   Allergies  Allergen Reactions  . Lipitor  [Atorvastatin] Nausea And Vomiting  . Simvastatin Nausea And Vomiting    DISCHARGE MEDICATIONS:   Current Discharge Medication List    START taking these medications   Details  cefUROXime (CEFTIN) 500 MG tablet Take 1 tablet (500 mg total) by mouth daily with supper. Qty: 6 tablet, Refills: 0    Morphine Sulfate (MORPHINE CONCENTRATE) 10 MG/0.5ML SOLN concentrated solution Place 0.25 mLs (5 mg total) under the tongue every 4 (four) hours as needed for moderate pain or shortness of breath (to treat air hunger if needed). Qty: 180 mL, Refills: 0      CONTINUE these medications which have CHANGED   Details  apixaban (ELIQUIS) 2.5 MG TABS tablet Take 1 tablet (2.5 mg total) by mouth 2 (two) times daily. Qty: 60 tablet, Refills: 2    predniSONE (DELTASONE) 10 MG tablet Start 50 mg daily and taper by 10 mg then stop Qty: 15 tablet, Refills: 0      CONTINUE these medications which have NOT CHANGED   Details  alendronate (FOSAMAX) 70 MG tablet Take 70 mg by mouth once a week. Take on Fridays.    arformoterol (BROVANA) 15 MCG/2ML NEBU Take 2 mLs (15 mcg total) by nebulization 4 (four) times daily - after meals and at bedtime. Qty: 180 mL, Refills: 3    azelastine (ASTELIN) 0.1 % nasal spray Place 1 spray into both nostrils 2 (two) times daily as needed. For congestion. Refills: 5    BENICAR 20 MG tablet Take 20 tablets by mouth  daily. Refills: 11    bimatoprost (LUMIGAN) 0.03 % ophthalmic solution Place 1 drop into both eyes 2 (two) times daily.     budesonide (PULMICORT) 0.5 MG/2ML nebulizer solution Take 2 mLs (0.5 mg total) by nebulization 2 (two) times daily. Qty: 120 mL, Refills: 3    clobetasol cream (TEMOVATE) [id number] % Apply 1 application topically 2 (two) times daily as needed. For psoriasis.    COMBIGAN 0.2-0.5 % ophthalmic solution Place 1 drop into both eyes 2 (two) times daily. Refills: 5    diltiazem (CARDIZEM CD) 180 MG 24 hr capsule Take 180 mg by mouth  daily. Refills: 10    furosemide (LASIX) 40 MG tablet Take 1 tablet by mouth daily.    levothyroxine (SYNTHROID, LEVOTHROID) 50 MCG tablet Take 50 mcg by mouth daily before breakfast.    OXYGEN Inhale 4 L into the lungs continuous.    potassium chloride (MICRO-K) 10 MEQ CR capsule Take 10 mEq by mouth daily.  Refills: 11    zolpidem (AMBIEN) 5 MG tablet Take 1 tablet (5 mg total) by mouth at bedtime. Qty: 30 tablet, Refills: 0      STOP taking these medications     doxycycline (VIBRA-TABS) 100 MG tablet         If you experience worsening of your admission symptoms, develop shortness of breath, life threatening emergency, suicidal or homicidal thoughts you must seek medical attention immediately by calling 911 or calling your MD immediately  if symptoms less severe.  You Must read complete instructions/literature along with all the possible adverse reactions/side effects for all the Medicines you take and that have been prescribed to you. Take any new Medicines after you have completely understood and accept all the possible adverse reactions/side effects.   Please note  You were cared for by a hospitalist during your hospital stay. If you have any questions about your discharge medications or the care you received while you were in the hospital after you are discharged, you can call the unit and asked to speak with the hospitalist on call if the hospitalist that took care of you is not available. Once you are discharged, your primary care physician will handle any further medical issues. Please note that NO REFILLS for any discharge medications will be authorized once you are discharged, as it is imperative that you return to your primary care physician (or establish a relationship with a primary care physician if you do not have one) for your aftercare needs so that they can reassess your need for medications and monitor your lab values. Today   SUBJECTIVE   i am ready to go   home VITAL SIGNS:  Blood pressure 150/85, pulse 68, temperature 98.4 F (36.9 C), temperature source Oral, resp. rate 17, height 5\' 2"  (1.575 m), weight 55.475 kg (122 lb 4.8 oz), SpO2 91 %.  I/O:    Intake/Output Summary (Last 24 hours) at 11/07/15 0944 Last data filed at 11/07/15 0614  Gross per 24 hour  Intake    240 ml  Output    850 ml  Net   -610 ml    PHYSICAL EXAMINATION:  GENERAL:  82 y.o.-year-old patient lying in the bed with no acute distress.  EYES: Pupils equal, round, reactive to light and accommodation. No scleral icterus. Extraocular muscles intact.  HEENT: Head atraumatic, normocephalic. Oropharynx and nasopharynx clear.  NECK:  Supple, no jugular venous distention. No thyroid enlargement, no tenderness.  LUNGS:distant breath sounds bilaterally, no wheezing, distant rales,no rhonchi .  No use of accessory muscles of respiration.  CARDIOVASCULAR: S1, S2 normal. No murmurs, rubs, or gallops.  ABDOMEN: Soft, non-tender, non-distended. Bowel sounds present. No organomegaly or mass.  EXTREMITIES: No pedal edema, cyanosis, or clubbing.  NEUROLOGIC: Cranial nerves II through XII are intact. Muscle strength 5/5 in all extremities. Sensation intact. Gait not checked.  PSYCHIATRIC: The patient is alert and oriented x 3.  SKIN: No obvious rash, lesion, or ulcer.   DATA REVIEW:   CBC   Recent Labs Lab 11/04/15 0552  WBC 10.9  HGB 14.2  HCT 43.8  PLT 294    Chemistries   Recent Labs Lab 10/31/15 1845 11/01/15 0412  11/04/15 0552  NA 143 142  < > 142  K 4.8 4.6  < > 4.1  CL 101 107  < > 95*  CO2 29 26  < > 33*  GLUCOSE 114* 141*  < > 130*  BUN 29* 27*  < > 46*  CREATININE 1.19* 1.20*  < > 1.22*  CALCIUM 9.1 8.1*  < > 9.0  MG  --  2.0  --   --   AST 41  --   --   --   ALT 23  --   --   --   ALKPHOS 79  --   --   --   BILITOT 0.7  --   --   --   < > = values in this interval not displayed.  Microbiology Results   Recent Results (from the past 240  hour(s))  Urine culture     Status: None   Collection Time: 10/31/15  6:45 PM  Result Value Ref Range Status   Specimen Description URINE, RANDOM  Final   Special Requests NONE  Final   Culture NO GROWTH 2 DAYS  Final   Report Status 11/02/2015 FINAL  Final  Blood Culture (routine x 2)     Status: None   Collection Time: 10/31/15  6:45 PM  Result Value Ref Range Status   Specimen Description BLOOD RIGHT HAND  Final   Special Requests BOTTLES DRAWN AEROBIC AND ANAEROBIC [place]  Final   Culture NO GROWTH 5 DAYS  Final   Report Status 11/05/2015 FINAL  Final  Blood Culture (routine x 2)     Status: None   Collection Time: 10/31/15  6:50 PM  Result Value Ref Range Status   Specimen Description BLOOD LEFT ARM  Final   Special Requests BOTTLES DRAWN AEROBIC AND ANAEROBIC [place]  Final   Culture NO GROWTH 5 DAYS  Final   Report Status 11/05/2015 FINAL  Final  MRSA PCR Screening     Status: None   Collection Time: 11/01/15  1:51 AM  Result Value Ref Range Status   MRSA by PCR NEGATIVE NEGATIVE Final    Comment:        The GeneXpert MRSA Assay (FDA approved for NASAL specimens only), is one component of a comprehensive MRSA colonization surveillance program. It is not intended to diagnose MRSA infection nor to guide or monitor treatment for MRSA infections.     RADIOLOGY:  No results found.   Management plans discussed with the patient, family and they are in agreement.  CODE STATUS:     Code Status Orders        Start     Ordered   10/31/15 2245  Do not attempt resuscitation (DNR)   Continuous    Question Answer Comment  In the event of cardiac or respiratory  ARREST Do not call a "code blue"   In the event of cardiac or respiratory ARREST Do not perform Intubation, CPR, defibrillation or ACLS   In the event of cardiac or respiratory ARREST Use medication by any route, position, wound care, and other measures to relive pain and suffering. May use  oxygen, suction and manual treatment of airway obstruction as needed for comfort.      10/31/15 2244    Code Status History    Date Active Date Inactive Code Status Order ID Comments User Context   10/04/2015  2:00 PM 10/05/2015  7:49 PM DNR [id number]  Demetrios Loll, MD Inpatient   08/27/2015 10:46 AM 10/04/2015  2:00 PM DNR [id number]  Flora Lipps, MD Outpatient   08/08/2015  3:28 PM 08/11/2015  5:28 PM Full Code [id number]  Gladstone Lighter, MD Inpatient    Advance Directive Documentation        Most Recent Value   Type of Advance Directive  Healthcare Power of Attorney   Pre-existing out of facility DNR order (yellow form or pink MOST form)     "MOST" Form in Place?        TOTAL TIME TAKING CARE OF THIS PATIENT: 40 minutes.    [name] M.D on 11/07/2015 at 9:44 AM  Between 7am to 6pm - Pager - [phone] After 6pm go to [email or web address] - password [email or web address] [facility]  [place] [facility]  Office  [phone]  CC: Primary care physician; Adin Hector, MD

## 2015-11-07 NOTE — Progress Notes (Signed)
Pt to be discharged this afternoon. Iv removed. disch instructions and prescrips given to her son. Pt to be discharged to  Home on her own 22. She is accompaneid by family.

## 2015-11-07 NOTE — Discharge Instructions (Signed)
HHRN Use your oxygen as before

## 2015-11-16 ENCOUNTER — Ambulatory Visit: Payer: Medicare HMO | Admitting: Internal Medicine

## 2015-11-16 ENCOUNTER — Encounter: Payer: Self-pay | Admitting: *Deleted

## 2015-11-17 ENCOUNTER — Telehealth: Payer: Self-pay | Admitting: Internal Medicine

## 2015-11-17 NOTE — Telephone Encounter (Signed)
FYI for you. 

## 2015-11-17 NOTE — Telephone Encounter (Signed)
Pt daughter in law calling stating they did not mean to miss the last apportionment  Appointment was made and she was not aware of that They will call us back if patient needs [place].  Pt is now in hospice  Please call if we need to discuss anything.

## 2015-12-09 DEATH — deceased

## 2016-08-31 IMAGING — CR DG CHEST 2V
1 series · 2 of 2 positions shown · non-contrast
Comparison: 11/16/2011

CLINICAL DATA: Short of breath and chest heaviness. Pulmonary
fibrosis

EXAM:
CHEST  2 VIEW

[Series 1: dxr chest pa (or ap) and lateral · 0.14mm/px · 2 of 2 slices shown]
[im 1/2]
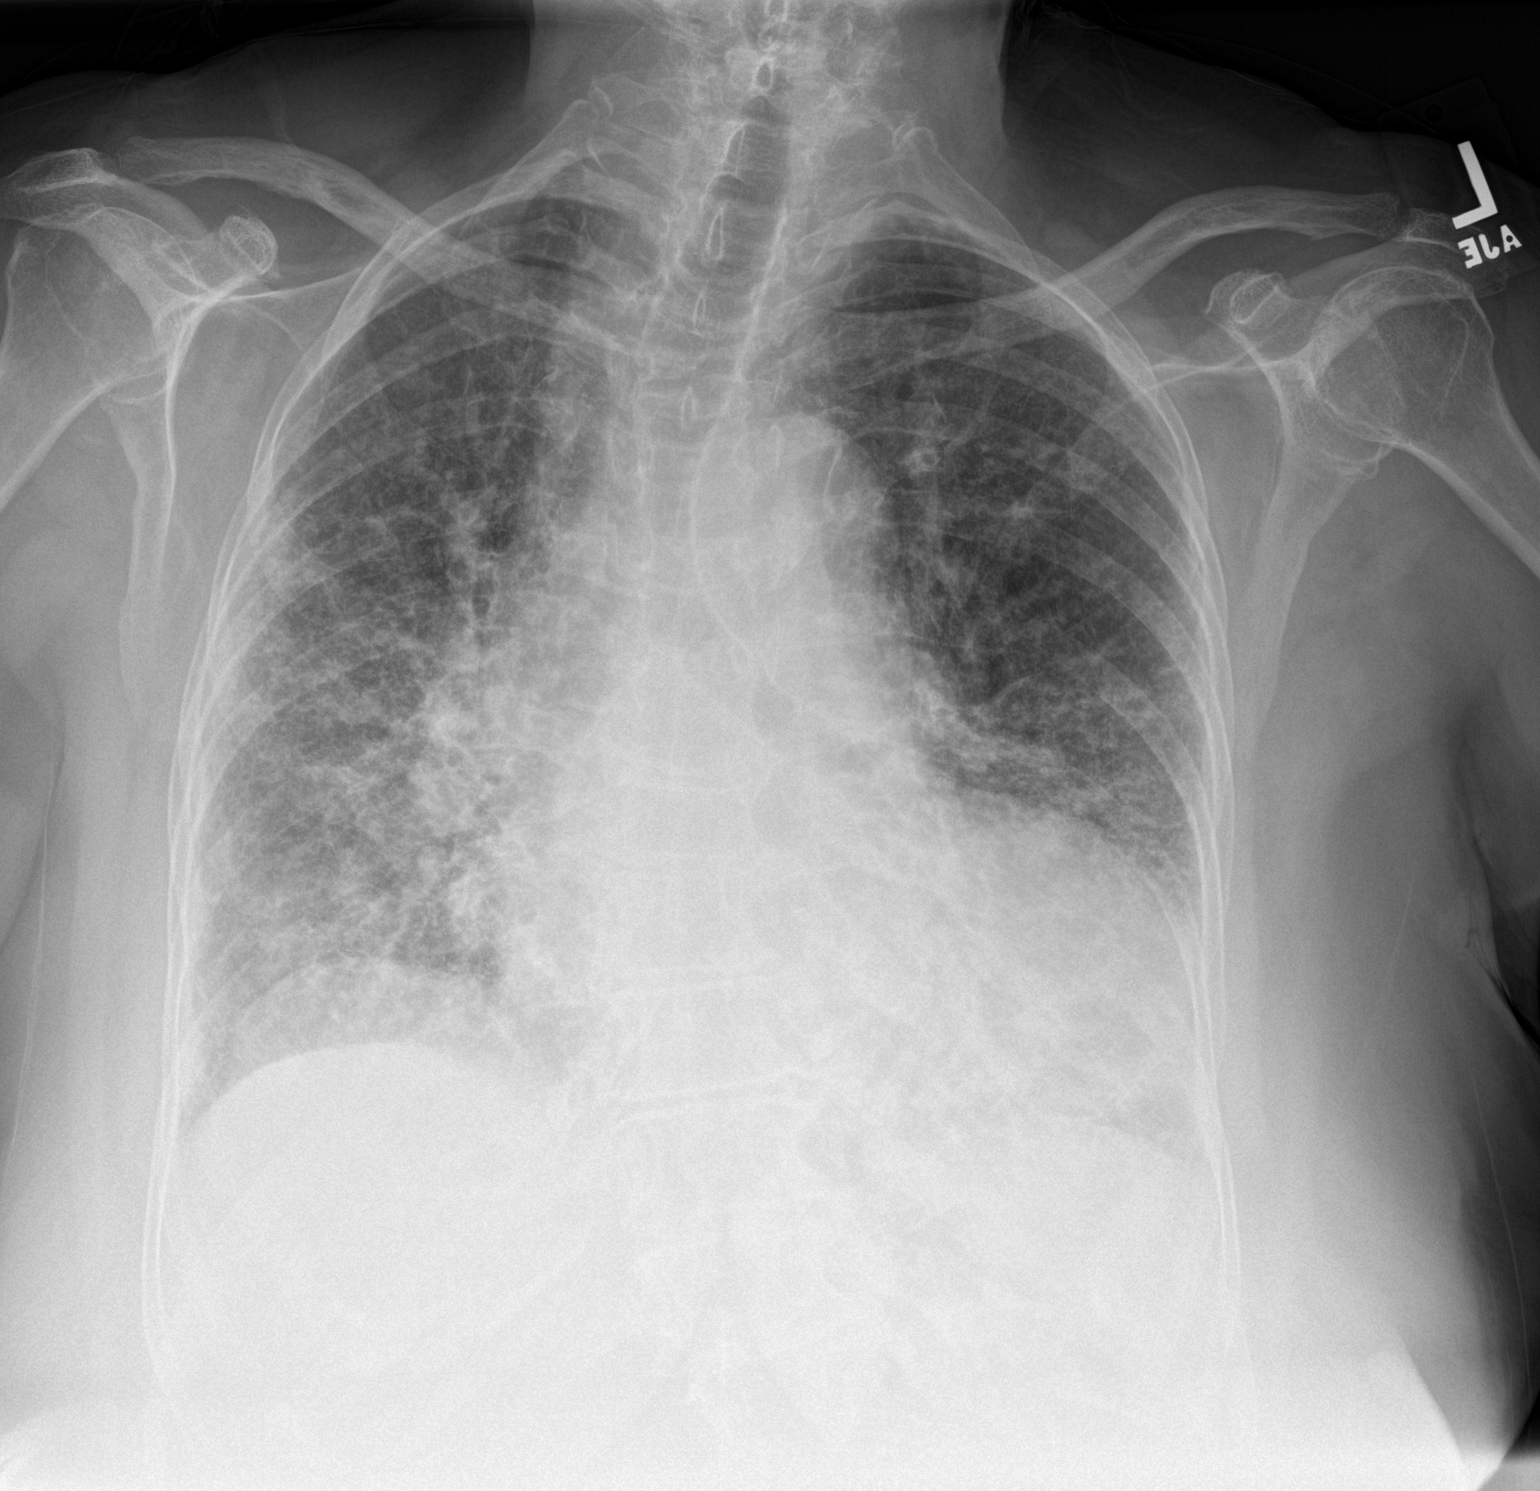
[im 2/2]
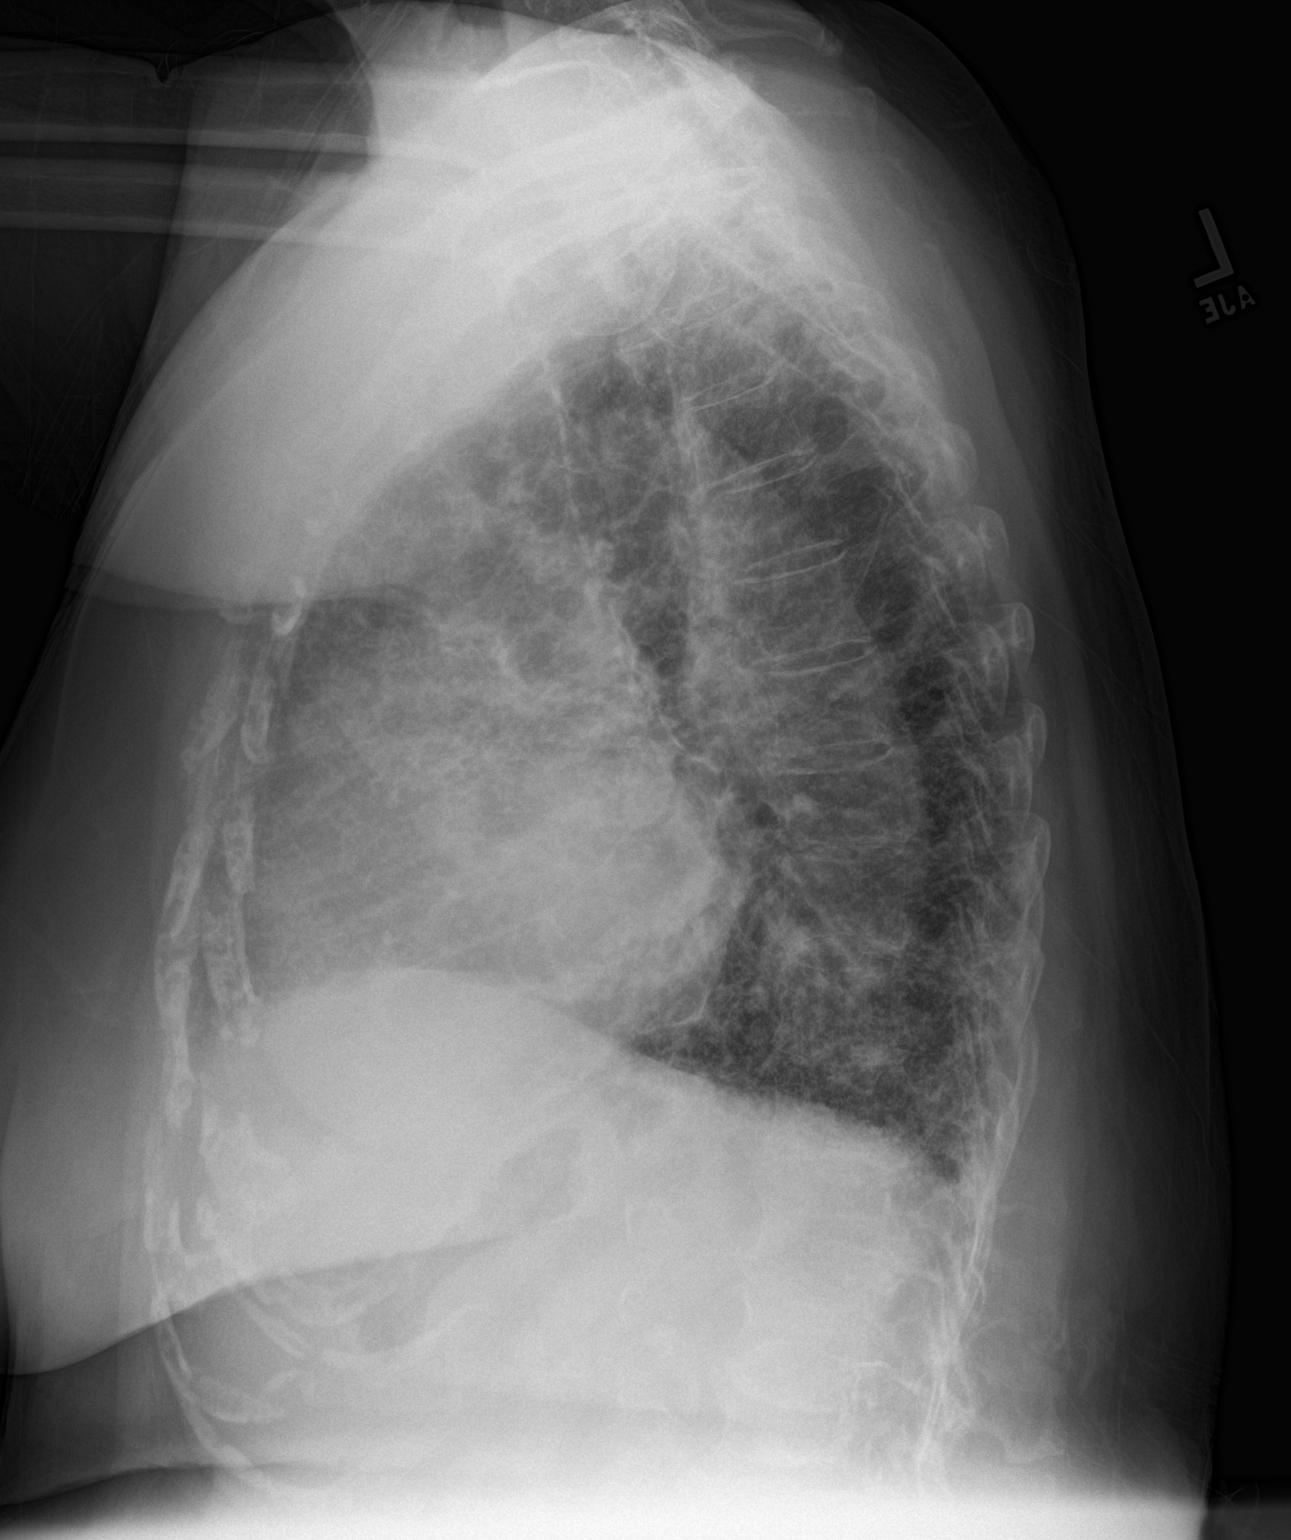

[2 of 2 positions shown; findings below may reference images not displayed]

FINDINGS: Chronic lung disease with pulmonary hyperinflation and diffuse
pulmonary scarring. Lung markings have progressed since the prior
chest x-ray. While this may be due to fibrosis, superimposed edema
or pneumonia cannot be excluded.

The heart is enlarged.  No pleural effusion.
IMPRESSION: Chronic lung disease with pulmonary fibrosis

Progressive lung density bilaterally and diffusely which may
represent superimposed edema or pneumonia. Correlate with clinical
symptoms.

## 2018-01-06 IMAGING — CT CT HEAD W/O CM
2 of 3 series · 16 of 30 positions shown, 19 images · non-contrast
Comparison: Head CT scan 08/19/2015.  Brain MRI 04/13/2014.

CLINICAL DATA: Acute onset altered mental status approximately 15
minutes ago. Initial encounter.

EXAM:
CT HEAD WITHOUT CONTRAST
TECHNIQUE: Contiguous axial images were obtained from the base of the skull
through the vertex without intravenous contrast.

[Series 2: head wo · axial · 0.47mm/px · z∈[-141,-30]mm · 10 of 29 slices shown, 13 images]
[im 3/29  brain]
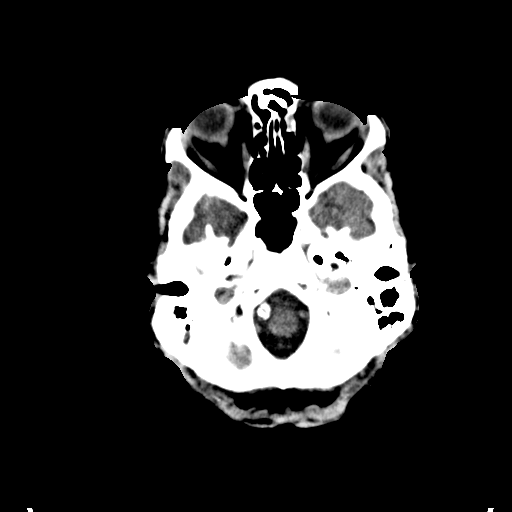
[im 3/29  bone]
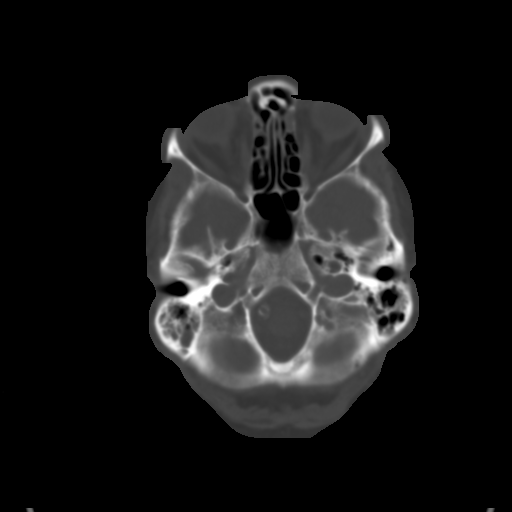
[im 6/29  brain]
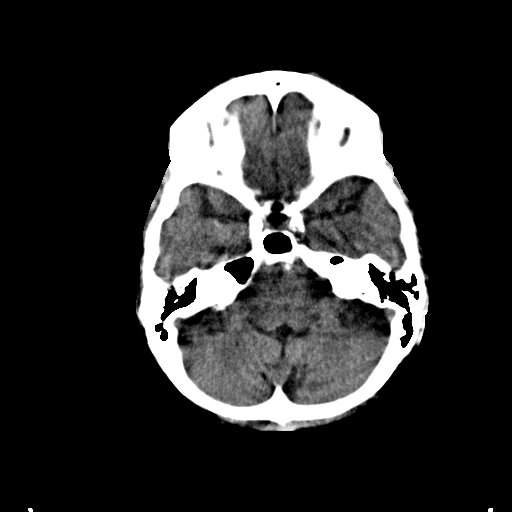
[im 8/29  brain]
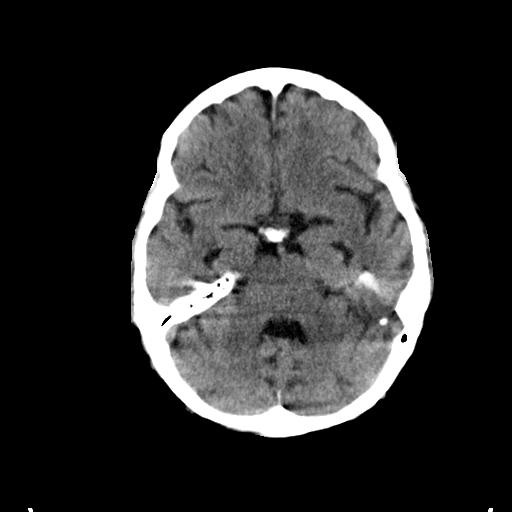
[im 11/29  brain]
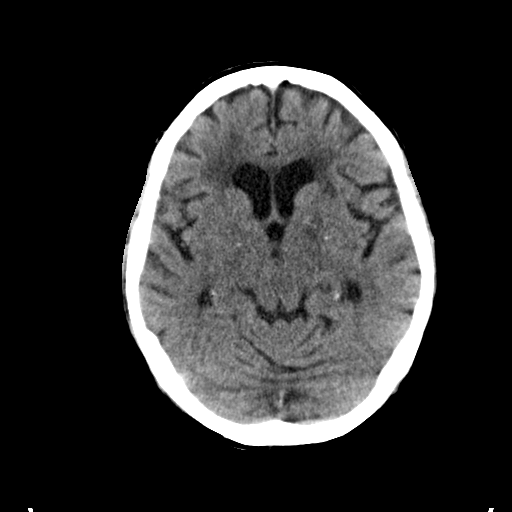
[im 13/29  brain]
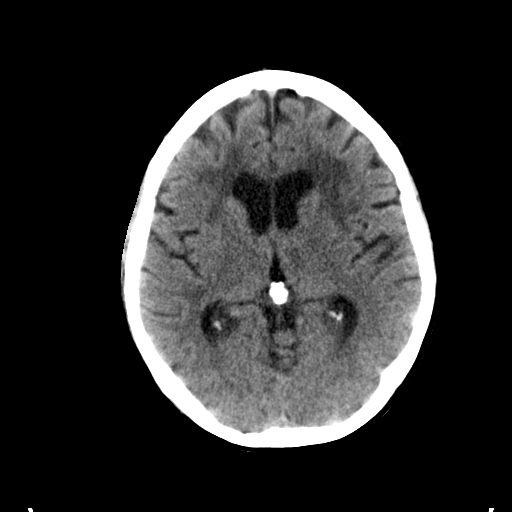
[im 13/29  bone]
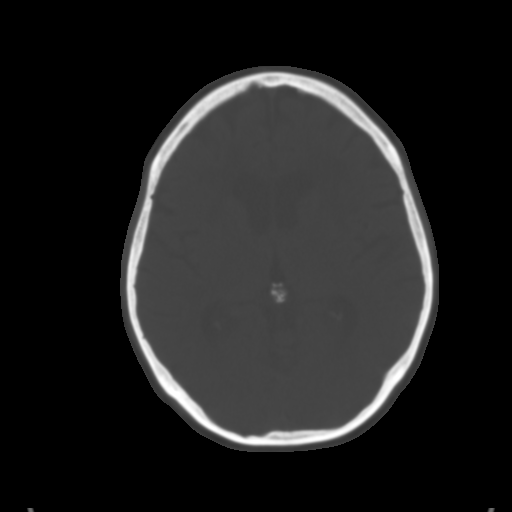
[im 16/29  brain]
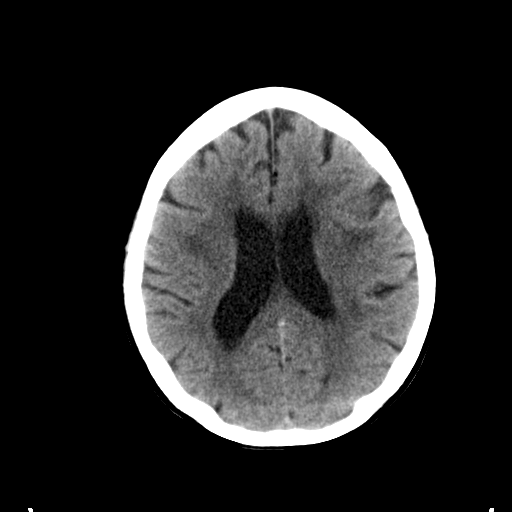
[im 18/29  brain]
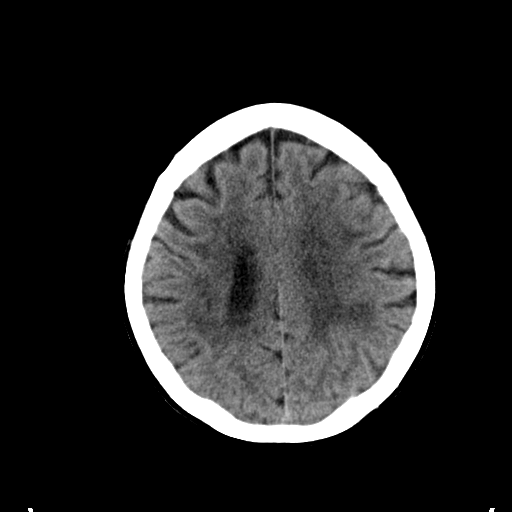
[im 21/29  brain]
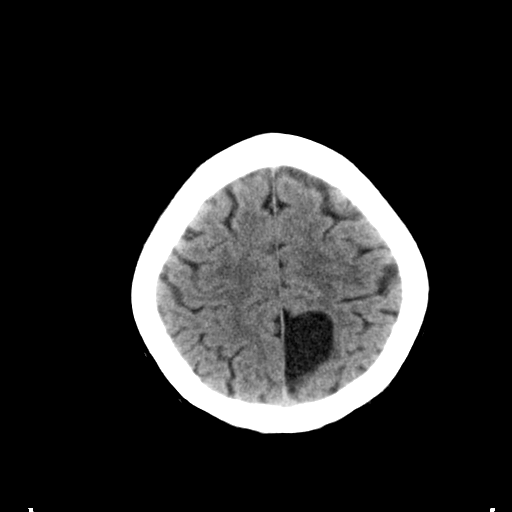
[im 23/29  brain]
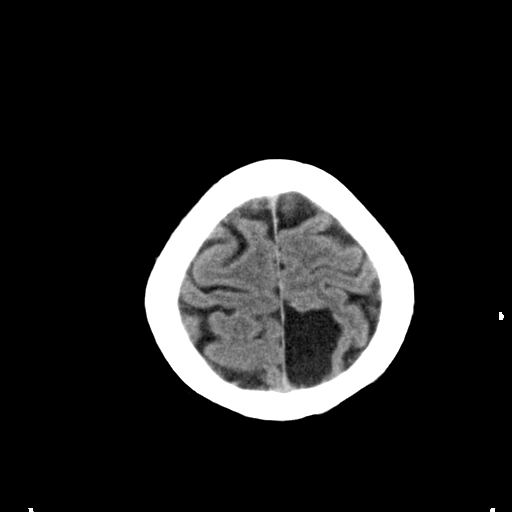
[im 23/29  bone]
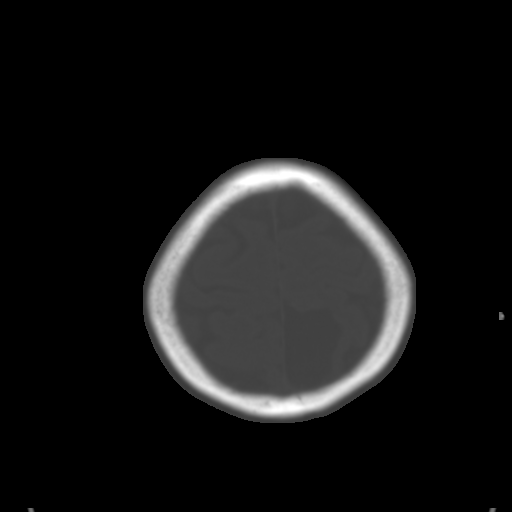
[im 26/29  brain]
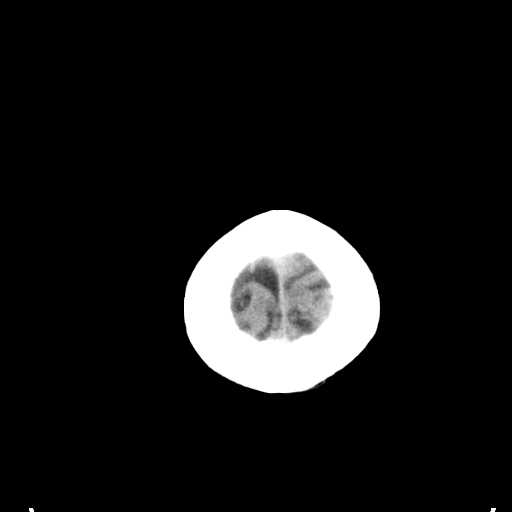

[Series 4: head bone · axial · 0.47mm/px · z∈[-141,-59]mm · 6 of 30 slices shown]
[im 3/30  bone]
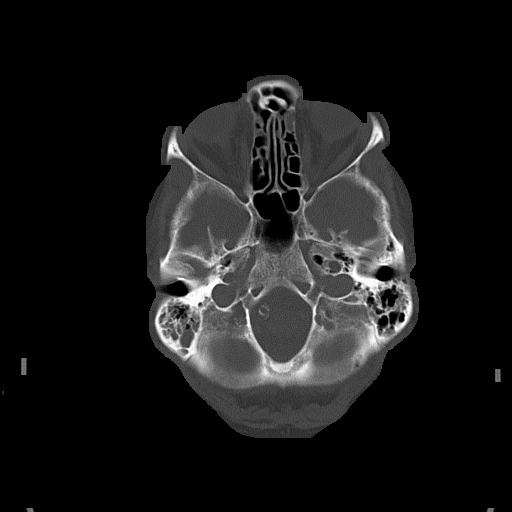
[im 8/30  bone]
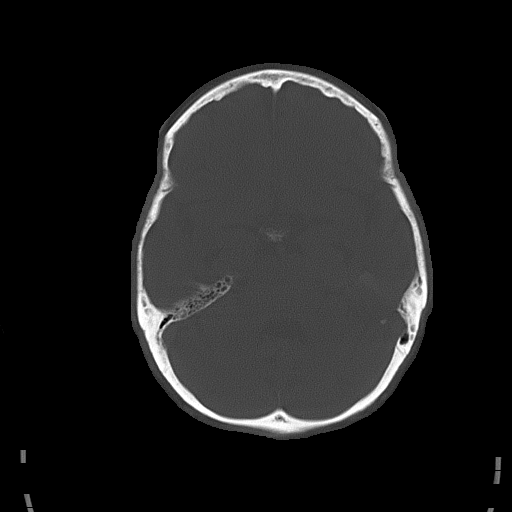
[im 10/30  bone]
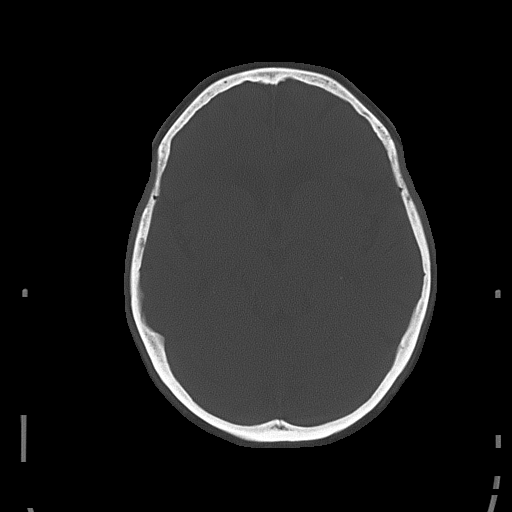
[im 13/30  bone]
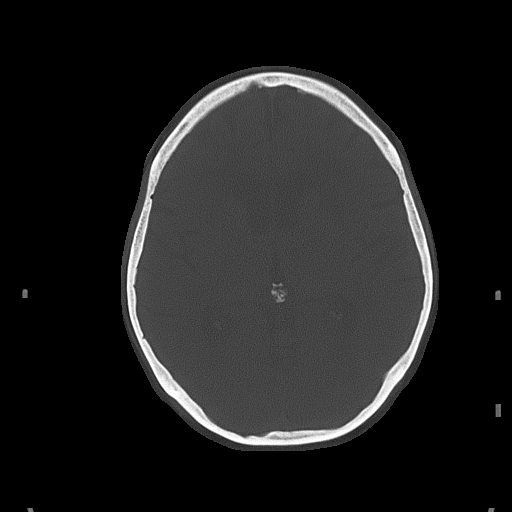
[im 17/30  bone]
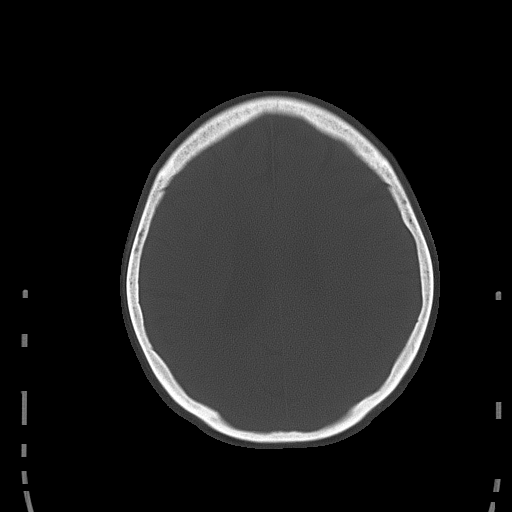
[im 20/30  bone]
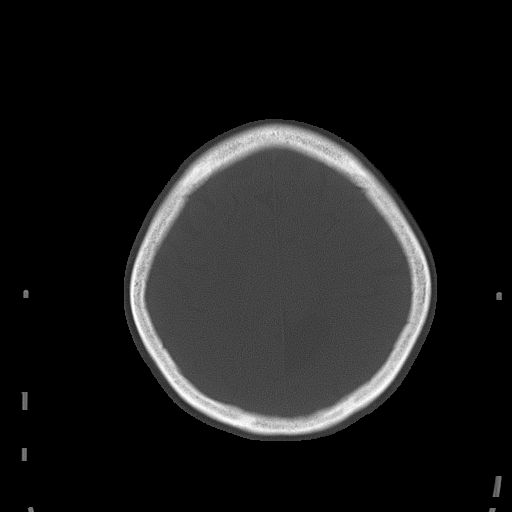

[16 of 30 positions shown; findings below may reference images not displayed]

FINDINGS: Arachnoid cyst in the left parietal lobe is unchanged. There is
extensive chronic microvascular ischemic change. No evidence of
acute abnormality including hemorrhage, infarct, mass lesion,
midline shift or subdural hemorrhage is identified. The calvarium is
intact. Chronic right mastoid effusion is noted. Left mastoid
effusion seen on the most recent examination has resolved.
IMPRESSION: No acute abnormality.

Extensive chronic microvascular ischemic change.

No change in an arachnoid cyst in the left parietal lobe.

These results were called by telephone at the time of interpretation
on 10/31/2015 at [DATE] to Dr. JELGER ALKAN , who verbally
acknowledged these results.

## 2018-01-08 IMAGING — DX DG CHEST 1V
1 series · 1 of 1 positions shown · non-contrast
Comparison: 10/31/2015, 11/01/2015 as well as 08/08/2015 and
06/25/2014. CT 06/25/2014

CLINICAL DATA: Cough and dyspnea.

EXAM:
CHEST 1 VIEW

[chest ap]
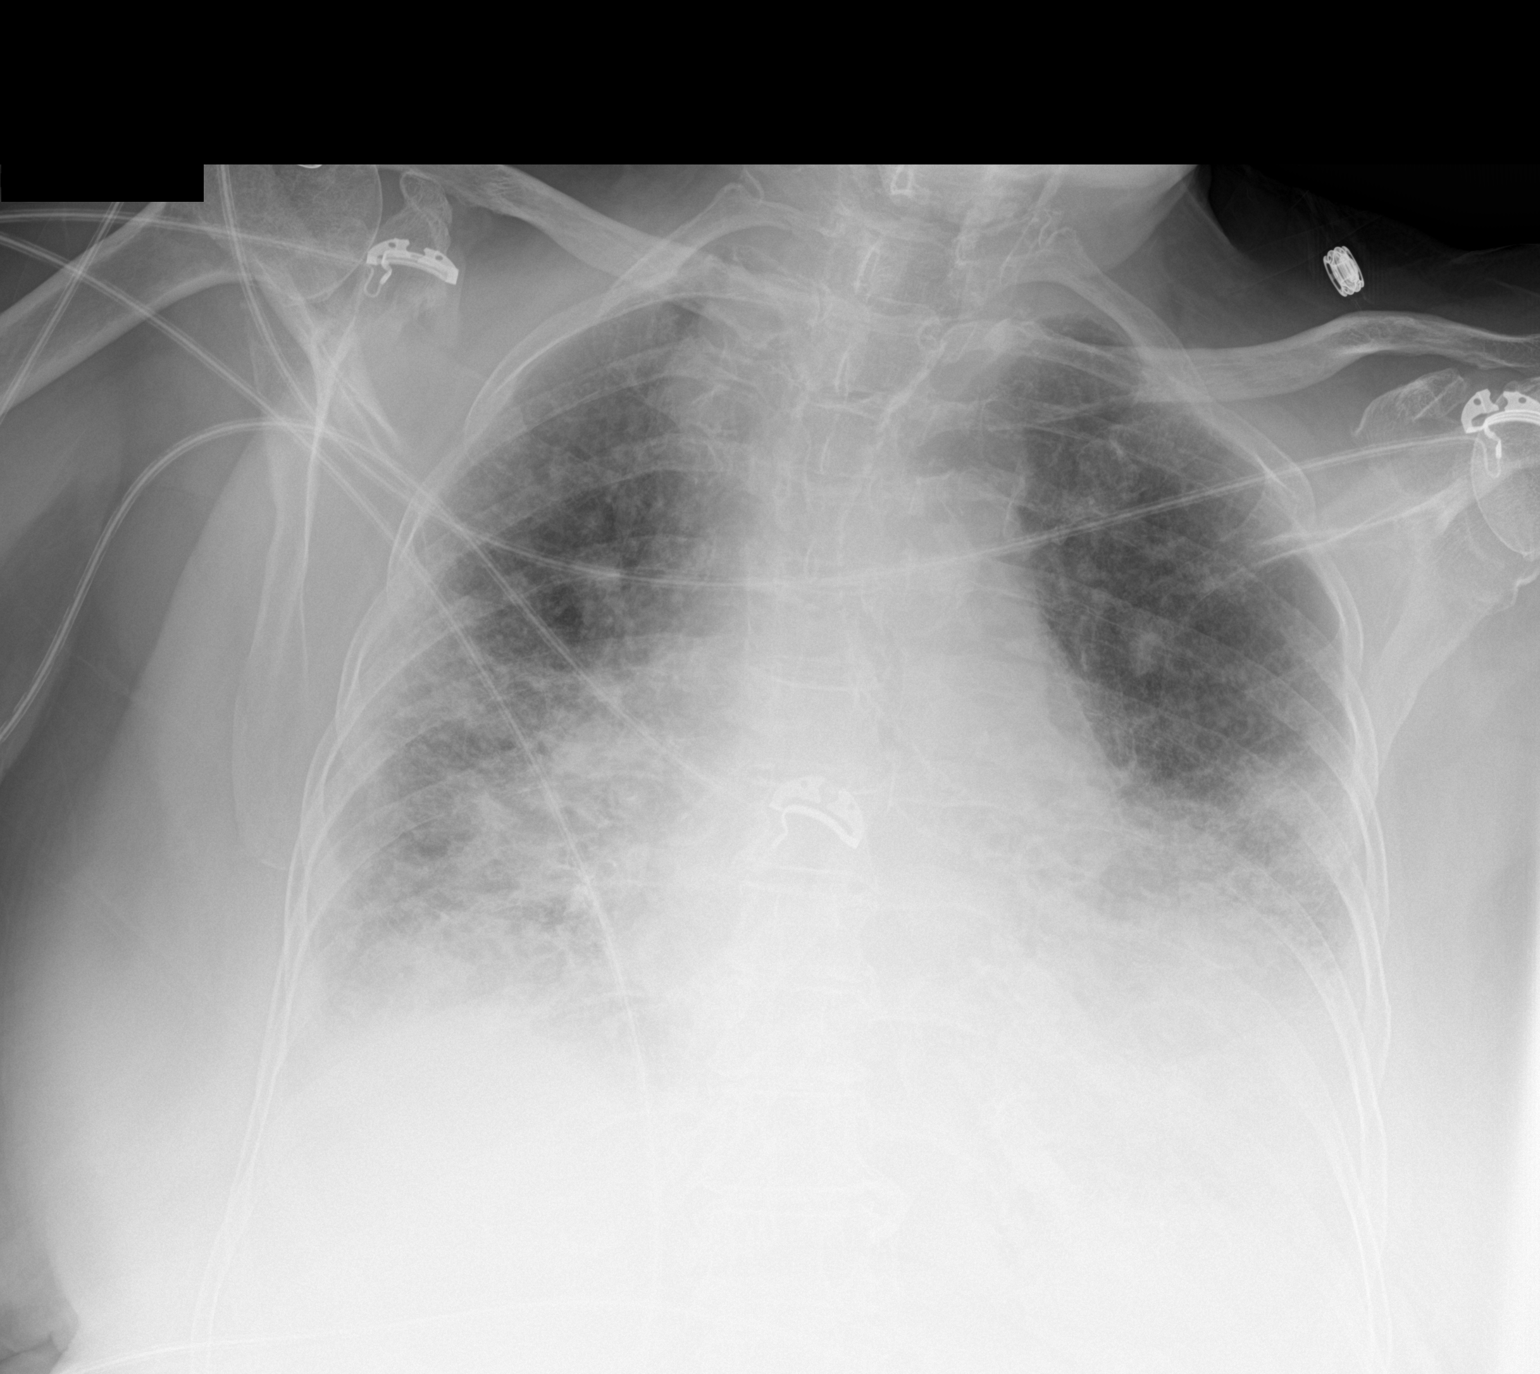

[1 of 1 positions shown; findings below may reference images not displayed]

FINDINGS: The patient slightly rotated to the left. Lungs are adequately
inflated demonstrate chronic interstitial prominence over the mid to
lower lungs. There is mild patchy airspace opacification over the
mid to lower lungs as cannot exclude superimposed infectious
process. Possible small amount of bilateral pleural fluid.

Stable cardiomegaly. Calcified plaque over the aortic arch.
Degenerative changes of the spine.
IMPRESSION: Chronic interstitial disease with mild patchy airspace opacification
over the mid to lower lungs as cannot exclude superimposed
infection. Possible small amount of bilateral pleural fluid.

Stable cardiomegaly.

## 2018-01-09 IMAGING — DX DG CHEST 1V
1 series · 1 of 1 positions shown · non-contrast
Comparison: 11/02/2015, 06/25/2014.  CT 06/25/2014

CLINICAL DATA: Shortness of breath .

EXAM:
CHEST 1 VIEW

[chest ap]
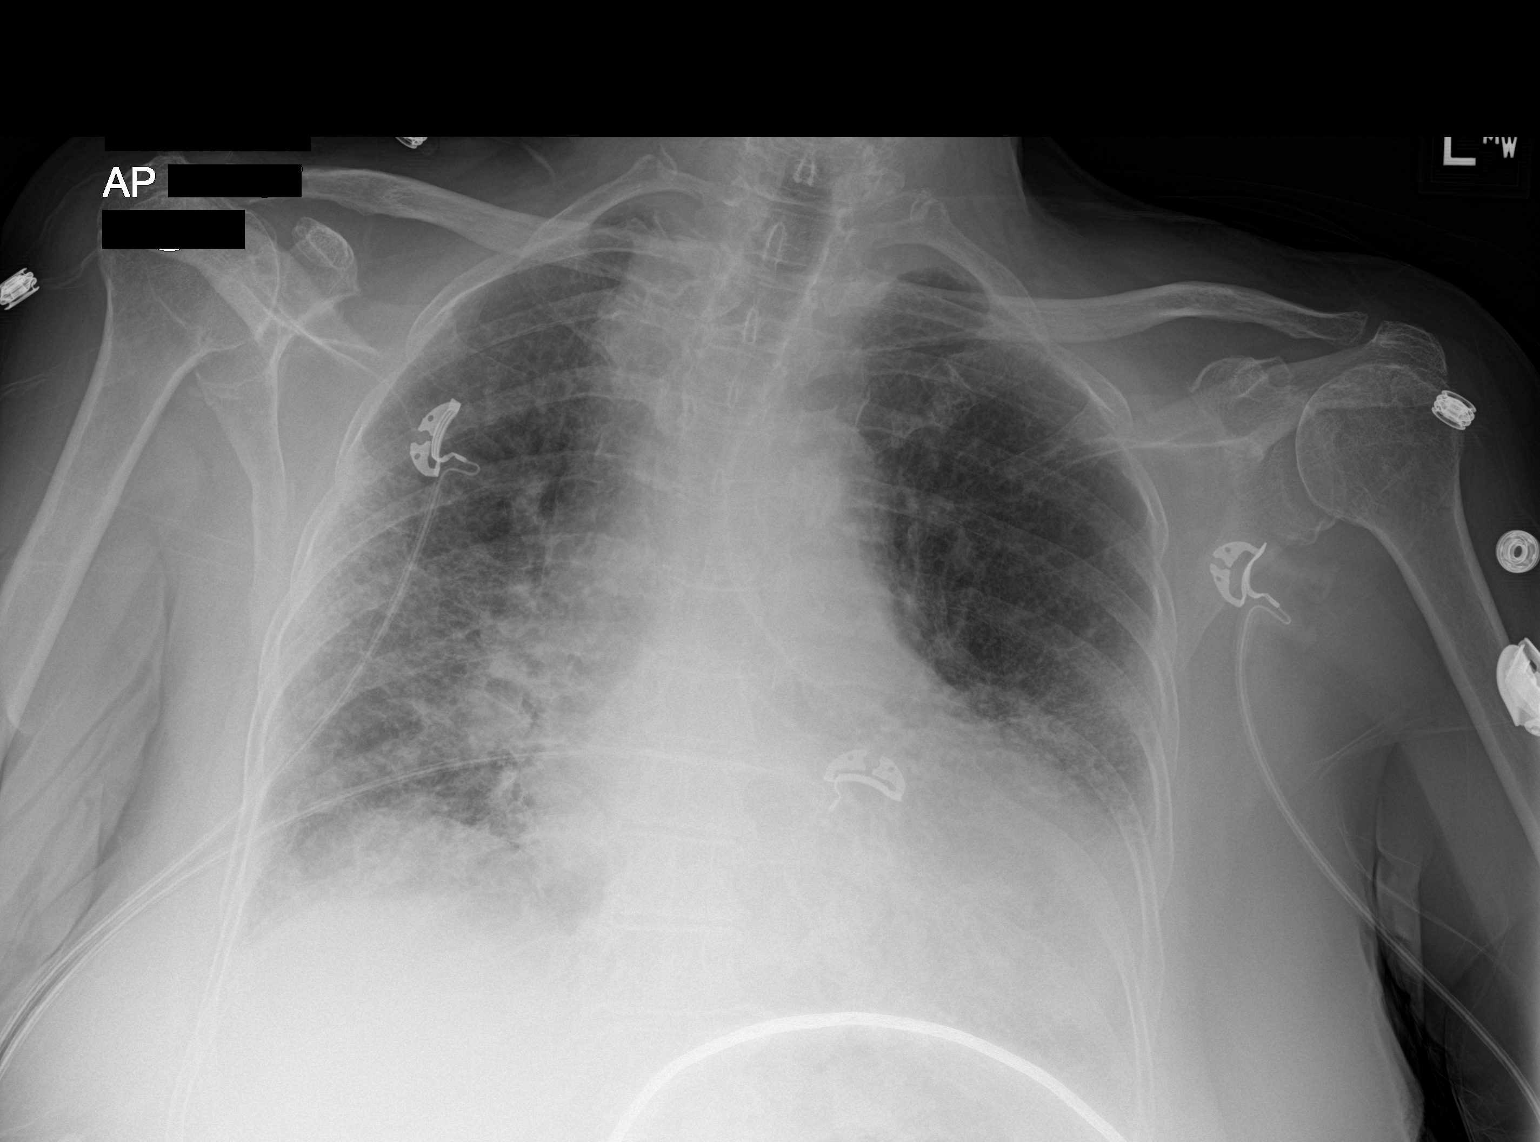

[1 of 1 positions shown; findings below may reference images not displayed]

FINDINGS: Stable mediastinal and hilar fullness noted consistent with
adenopathy. Similar findings noted on prior CT of 06/25/2014. Hilar
structures stable. Persistent cardiomegaly. Interim improvement
bilateral interstitial prominence suggesting improvement of
congestive heart failure. Underlying chronic interstitial changes
are present consistent with chronic interstitial lung disease. Tiny
bilateral pleural effusions cannot be excluded. No pneumothorax.
IMPRESSION: 1. Mediastinum and hilar fullness again noted consistent with stable
mediastinal and hilar adenopathy. Similar findings noted on prior CT
chest 06/25/2014.
2. Cardiomegaly with improving interstitial prominence consistent
with improving congestive heart failure. Underlying chronic
interstitial lung disease is present. Tiny bilateral pleural
effusions cannot be excluded.
